# Patient Record
Sex: Male | Born: 1950 | Race: Black or African American | Hispanic: No | Smoking: Current every day smoker
Health system: Southern US, Community
[De-identification: ages and names within clinical notes are randomized; demographics above are authoritative.]

## PROBLEM LIST (undated history)

## (undated) DIAGNOSIS — Z87898 Personal history of other specified conditions: Secondary | ICD-10-CM

## (undated) DIAGNOSIS — R410 Disorientation, unspecified: Secondary | ICD-10-CM

## (undated) DIAGNOSIS — Z8719 Personal history of other diseases of the digestive system: Secondary | ICD-10-CM

## (undated) DIAGNOSIS — E876 Hypokalemia: Secondary | ICD-10-CM

## (undated) DIAGNOSIS — F1191 Opioid use, unspecified, in remission: Secondary | ICD-10-CM

## (undated) DIAGNOSIS — I10 Essential (primary) hypertension: Secondary | ICD-10-CM

## (undated) DIAGNOSIS — J811 Chronic pulmonary edema: Secondary | ICD-10-CM

## (undated) HISTORY — DX: Chronic pulmonary edema: J81.1

## (undated) HISTORY — PX: SHOULDER SURGERY: SHX246

## (undated) HISTORY — DX: Opioid use, unspecified, in remission: F11.91

## (undated) HISTORY — DX: Personal history of other diseases of the digestive system: Z87.19

## (undated) HISTORY — PX: CHOLECYSTECTOMY: SHX55

## (undated) HISTORY — DX: Disorientation, unspecified: R41.0

## (undated) HISTORY — DX: Hypokalemia: E87.6

## (undated) HISTORY — DX: Personal history of other specified conditions: Z87.898

---

## 1997-05-07 ENCOUNTER — Emergency Department (HOSPITAL_COMMUNITY): Admission: EM | Admit: 1997-05-07 | Discharge: 1997-05-07 | Payer: Self-pay | Admitting: Emergency Medicine

## 1997-06-11 ENCOUNTER — Emergency Department (HOSPITAL_COMMUNITY): Admission: EM | Admit: 1997-06-11 | Discharge: 1997-06-11 | Payer: Self-pay | Admitting: Cardiology

## 1997-08-09 ENCOUNTER — Inpatient Hospital Stay (HOSPITAL_COMMUNITY): Admission: EM | Admit: 1997-08-09 | Discharge: 1997-08-14 | Payer: Self-pay | Admitting: Emergency Medicine

## 1997-10-30 ENCOUNTER — Ambulatory Visit (HOSPITAL_COMMUNITY): Admission: RE | Admit: 1997-10-30 | Discharge: 1997-10-31 | Payer: Self-pay | Admitting: Orthopedic Surgery

## 2001-05-12 ENCOUNTER — Emergency Department (HOSPITAL_COMMUNITY): Admission: EM | Admit: 2001-05-12 | Discharge: 2001-05-12 | Payer: Self-pay | Admitting: Emergency Medicine

## 2004-11-25 ENCOUNTER — Inpatient Hospital Stay: Payer: Self-pay | Admitting: Internal Medicine

## 2004-11-25 ENCOUNTER — Other Ambulatory Visit: Payer: Self-pay

## 2005-01-10 ENCOUNTER — Observation Stay: Payer: Self-pay | Admitting: Internal Medicine

## 2005-05-19 ENCOUNTER — Encounter: Payer: Self-pay | Admitting: Orthopedic Surgery

## 2006-04-17 ENCOUNTER — Emergency Department (HOSPITAL_COMMUNITY): Admission: EM | Admit: 2006-04-17 | Discharge: 2006-04-17 | Payer: Self-pay | Admitting: Family Medicine

## 2006-07-08 ENCOUNTER — Emergency Department (HOSPITAL_COMMUNITY): Admission: EM | Admit: 2006-07-08 | Discharge: 2006-07-08 | Payer: Self-pay | Admitting: Emergency Medicine

## 2007-05-07 IMAGING — CR DG CHEST 2V
1 series · 2 of 2 positions shown · non-contrast
Comparison: none

REASON FOR EXAM: Confusion
COMMENTS:  LMP: (Male)

PROCEDURE:     DXR - DXR CHEST PA (OR AP) AND LATERAL  - January 10, 2005  [DATE]
RESULT:     Comparison is made to the prior study dated 11/25/04.
Lungs are clear.  The cardiac silhouette and visualized bony skeleton are
unremarkable.

[Series 2906: postero_anterior · 0.11mm/px · 2 of 2 slices shown]
[im 1/2]
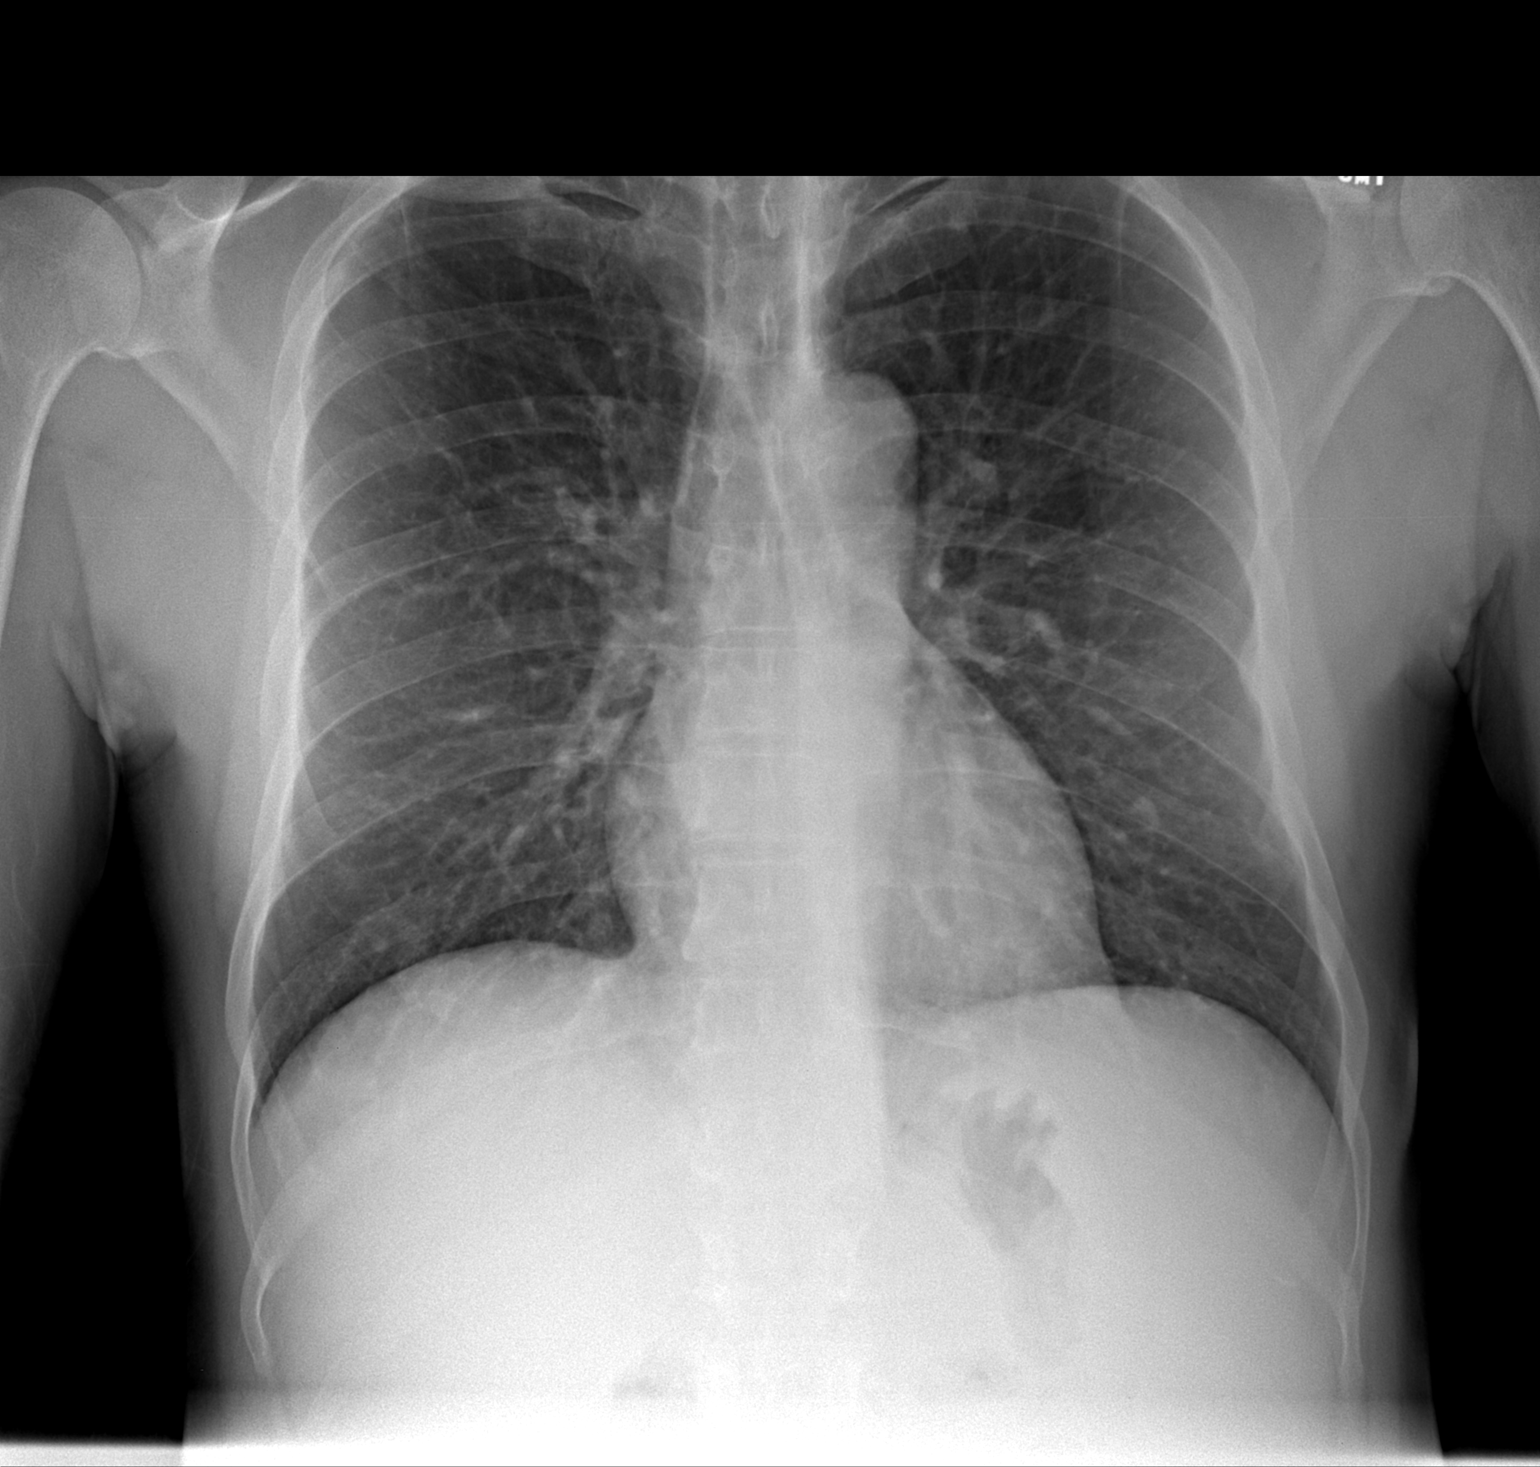
[im 2/2]
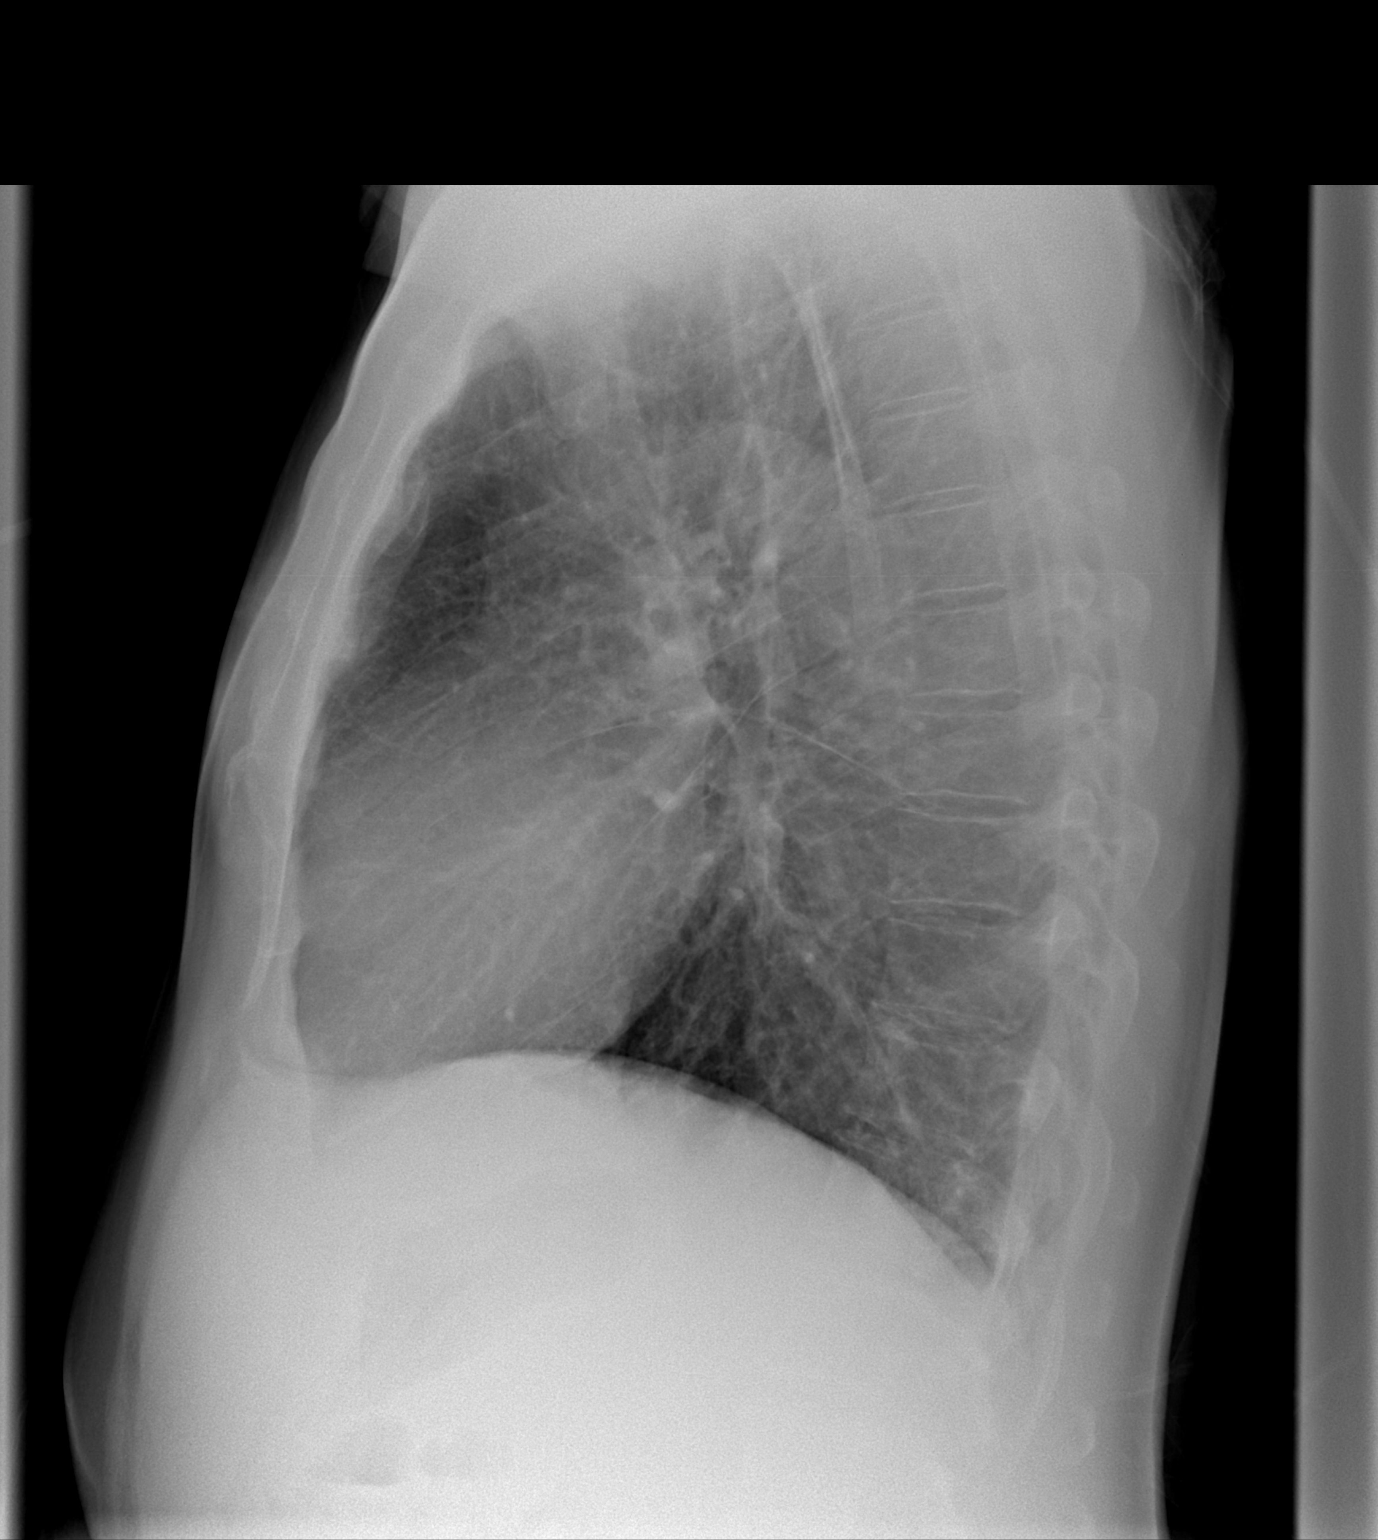

[2 of 2 positions shown; findings below may reference images not displayed]

IMPRESSION: Chest radiograph without evidence of acute cardiopulmonary disease.

## 2008-08-28 ENCOUNTER — Emergency Department: Payer: Self-pay | Admitting: Emergency Medicine

## 2008-12-29 ENCOUNTER — Emergency Department (HOSPITAL_COMMUNITY): Admission: EM | Admit: 2008-12-29 | Discharge: 2008-12-29 | Payer: Self-pay | Admitting: Emergency Medicine

## 2010-03-25 ENCOUNTER — Emergency Department (HOSPITAL_COMMUNITY)
Admission: EM | Admit: 2010-03-25 | Discharge: 2010-03-25 | Disposition: A | Discharge status: Home / Self Care | Payer: Self-pay | Attending: Emergency Medicine | Admitting: Emergency Medicine

## 2010-03-25 ENCOUNTER — Emergency Department (HOSPITAL_COMMUNITY): Payer: Self-pay

## 2010-03-25 DIAGNOSIS — E119 Type 2 diabetes mellitus without complications: Secondary | ICD-10-CM | POA: Insufficient documentation

## 2010-03-25 DIAGNOSIS — F1110 Opioid abuse, uncomplicated: Secondary | ICD-10-CM | POA: Insufficient documentation

## 2010-03-25 DIAGNOSIS — W010XXA Fall on same level from slipping, tripping and stumbling without subsequent striking against object, initial encounter: Secondary | ICD-10-CM | POA: Insufficient documentation

## 2010-03-25 DIAGNOSIS — S42033A Displaced fracture of lateral end of unspecified clavicle, initial encounter for closed fracture: Secondary | ICD-10-CM | POA: Insufficient documentation

## 2010-03-25 DIAGNOSIS — M25519 Pain in unspecified shoulder: Secondary | ICD-10-CM | POA: Insufficient documentation

## 2010-03-25 DIAGNOSIS — R42 Dizziness and giddiness: Secondary | ICD-10-CM | POA: Insufficient documentation

## 2010-03-25 DIAGNOSIS — Z794 Long term (current) use of insulin: Secondary | ICD-10-CM | POA: Insufficient documentation

## 2010-03-25 LAB — RAPID URINE DRUG SCREEN, HOSP PERFORMED
Benzodiazepines: NOT DETECTED
Cocaine: NOT DETECTED

## 2010-03-25 LAB — GLUCOSE, CAPILLARY: Glucose-Capillary: 203 mg/dL — ABNORMAL HIGH (ref 70–99)

## 2010-03-25 LAB — COMPREHENSIVE METABOLIC PANEL
ALT: 95 U/L — ABNORMAL HIGH (ref 0–53)
AST: 115 U/L — ABNORMAL HIGH (ref 0–37)
Calcium: 8.2 mg/dL — ABNORMAL LOW (ref 8.4–10.5)
Sodium: 139 mEq/L (ref 135–145)
Total Protein: 6.5 g/dL (ref 6.0–8.3)

## 2010-03-25 LAB — DIFFERENTIAL
Basophils Absolute: 0 10*3/uL (ref 0.0–0.1)
Basophils Relative: 0 % (ref 0–1)
Eosinophils Absolute: 0.1 10*3/uL (ref 0.0–0.7)
Eosinophils Relative: 2 % (ref 0–5)
Lymphocytes Relative: 24 % (ref 12–46)
Lymphs Abs: 1.1 10*3/uL (ref 0.7–4.0)
Monocytes Absolute: 0.4 10*3/uL (ref 0.1–1.0)
Monocytes Relative: 9 % (ref 3–12)
Neutro Abs: 3 10*3/uL (ref 1.7–7.7)
Neutrophils Relative %: 65 % (ref 43–77)

## 2010-03-25 LAB — CBC
MCH: 29.6 pg (ref 26.0–34.0)
MCHC: 33 g/dL (ref 30.0–36.0)
Platelets: 58 10*3/uL — ABNORMAL LOW (ref 150–400)

## 2010-04-07 ENCOUNTER — Emergency Department (HOSPITAL_COMMUNITY): Payer: Non-veteran care

## 2010-04-07 ENCOUNTER — Emergency Department (HOSPITAL_COMMUNITY)
Admission: EM | Admit: 2010-04-07 | Discharge: 2010-04-07 | Disposition: A | Discharge status: Home / Self Care | Payer: Non-veteran care | Attending: Emergency Medicine | Admitting: Emergency Medicine

## 2010-04-07 DIAGNOSIS — Z794 Long term (current) use of insulin: Secondary | ICD-10-CM | POA: Insufficient documentation

## 2010-04-07 DIAGNOSIS — Z79899 Other long term (current) drug therapy: Secondary | ICD-10-CM | POA: Insufficient documentation

## 2010-04-07 DIAGNOSIS — E119 Type 2 diabetes mellitus without complications: Secondary | ICD-10-CM | POA: Insufficient documentation

## 2010-04-07 DIAGNOSIS — S42033A Displaced fracture of lateral end of unspecified clavicle, initial encounter for closed fracture: Secondary | ICD-10-CM | POA: Insufficient documentation

## 2010-04-07 DIAGNOSIS — M25519 Pain in unspecified shoulder: Secondary | ICD-10-CM | POA: Insufficient documentation

## 2010-04-07 DIAGNOSIS — Z09 Encounter for follow-up examination after completed treatment for conditions other than malignant neoplasm: Secondary | ICD-10-CM | POA: Insufficient documentation

## 2010-04-07 DIAGNOSIS — W19XXXA Unspecified fall, initial encounter: Secondary | ICD-10-CM | POA: Insufficient documentation

## 2010-04-20 ENCOUNTER — Emergency Department (HOSPITAL_COMMUNITY)
Admission: EM | Admit: 2010-04-20 | Discharge: 2010-04-21 | Disposition: A | Discharge status: Home / Self Care | Payer: Non-veteran care | Attending: Emergency Medicine | Admitting: Emergency Medicine

## 2010-04-20 DIAGNOSIS — T400X1A Poisoning by opium, accidental (unintentional), initial encounter: Secondary | ICD-10-CM | POA: Insufficient documentation

## 2010-04-20 DIAGNOSIS — E119 Type 2 diabetes mellitus without complications: Secondary | ICD-10-CM | POA: Insufficient documentation

## 2010-04-20 DIAGNOSIS — Z79899 Other long term (current) drug therapy: Secondary | ICD-10-CM | POA: Insufficient documentation

## 2010-04-20 LAB — CBC
HCT: 37.5 % — ABNORMAL LOW (ref 39.0–52.0)
Hemoglobin: 12.3 g/dL — ABNORMAL LOW (ref 13.0–17.0)
MCH: 29.7 pg (ref 26.0–34.0)
MCHC: 32.8 g/dL (ref 30.0–36.0)
MCV: 90.6 fL (ref 78.0–100.0)

## 2010-04-20 LAB — DIFFERENTIAL
Basophils Relative: 0 % (ref 0–1)
Lymphs Abs: 1.2 10*3/uL (ref 0.7–4.0)
Monocytes Absolute: 1 10*3/uL (ref 0.1–1.0)
Monocytes Relative: 7 % (ref 3–12)
Neutro Abs: 12 10*3/uL — ABNORMAL HIGH (ref 1.7–7.7)

## 2010-04-21 ENCOUNTER — Emergency Department (HOSPITAL_COMMUNITY): Payer: Non-veteran care

## 2010-04-21 ENCOUNTER — Inpatient Hospital Stay (HOSPITAL_COMMUNITY)
Admission: EM | Admit: 2010-04-21 | Discharge: 2010-04-27 | DRG: 917 | Disposition: A | Discharge status: Home Health | Payer: Non-veteran care | Attending: Internal Medicine | Admitting: Internal Medicine

## 2010-04-21 DIAGNOSIS — R000 Tachycardia, unspecified: Secondary | ICD-10-CM | POA: Diagnosis present

## 2010-04-21 DIAGNOSIS — R404 Transient alteration of awareness: Secondary | ICD-10-CM | POA: Diagnosis present

## 2010-04-21 DIAGNOSIS — F17200 Nicotine dependence, unspecified, uncomplicated: Secondary | ICD-10-CM | POA: Diagnosis present

## 2010-04-21 DIAGNOSIS — F1020 Alcohol dependence, uncomplicated: Secondary | ICD-10-CM | POA: Diagnosis present

## 2010-04-21 DIAGNOSIS — T424X1A Poisoning by benzodiazepines, accidental (unintentional), initial encounter: Secondary | ICD-10-CM | POA: Diagnosis present

## 2010-04-21 DIAGNOSIS — T424X4A Poisoning by benzodiazepines, undetermined, initial encounter: Principal | ICD-10-CM | POA: Diagnosis present

## 2010-04-21 DIAGNOSIS — X58XXXA Exposure to other specified factors, initial encounter: Secondary | ICD-10-CM | POA: Diagnosis present

## 2010-04-21 DIAGNOSIS — J189 Pneumonia, unspecified organism: Secondary | ICD-10-CM | POA: Diagnosis present

## 2010-04-21 DIAGNOSIS — F1111 Opioid abuse, in remission: Secondary | ICD-10-CM | POA: Diagnosis present

## 2010-04-21 DIAGNOSIS — K7030 Alcoholic cirrhosis of liver without ascites: Secondary | ICD-10-CM | POA: Diagnosis present

## 2010-04-21 DIAGNOSIS — S42009A Fracture of unspecified part of unspecified clavicle, initial encounter for closed fracture: Secondary | ICD-10-CM | POA: Diagnosis present

## 2010-04-21 DIAGNOSIS — E876 Hypokalemia: Secondary | ICD-10-CM | POA: Diagnosis present

## 2010-04-21 DIAGNOSIS — E119 Type 2 diabetes mellitus without complications: Secondary | ICD-10-CM | POA: Diagnosis present

## 2010-04-21 DIAGNOSIS — Z7982 Long term (current) use of aspirin: Secondary | ICD-10-CM

## 2010-04-21 DIAGNOSIS — D696 Thrombocytopenia, unspecified: Secondary | ICD-10-CM | POA: Diagnosis present

## 2010-04-21 LAB — BASIC METABOLIC PANEL
BUN: 25 mg/dL — ABNORMAL HIGH (ref 6–23)
CO2: 25 mEq/L (ref 19–32)
Chloride: 103 mEq/L (ref 96–112)
Creatinine, Ser: 1.2 mg/dL (ref 0.4–1.5)
Glucose, Bld: 288 mg/dL — ABNORMAL HIGH (ref 70–99)

## 2010-04-21 LAB — POCT I-STAT, CHEM 8
Calcium, Ion: 1.08 mmol/L — ABNORMAL LOW (ref 1.12–1.32)
Glucose, Bld: 152 mg/dL — ABNORMAL HIGH (ref 70–99)
HCT: 40 % (ref 39.0–52.0)
TCO2: 30 mmol/L (ref 0–100)

## 2010-04-21 LAB — GLUCOSE, CAPILLARY

## 2010-04-21 LAB — RAPID URINE DRUG SCREEN, HOSP PERFORMED
Amphetamines: NOT DETECTED
Benzodiazepines: NOT DETECTED
Cocaine: NOT DETECTED
Tetrahydrocannabinol: NOT DETECTED

## 2010-04-21 LAB — HEPATIC FUNCTION PANEL
Bilirubin, Direct: 0.3 mg/dL (ref 0.0–0.3)
Indirect Bilirubin: 0.6 mg/dL (ref 0.3–0.9)
Total Protein: 7.1 g/dL (ref 6.0–8.3)

## 2010-04-21 LAB — ACETAMINOPHEN LEVEL

## 2010-04-22 ENCOUNTER — Emergency Department (HOSPITAL_COMMUNITY): Payer: Non-veteran care

## 2010-04-22 LAB — DIFFERENTIAL
Basophils Absolute: 0 10*3/uL (ref 0.0–0.1)
Basophils Relative: 0 % (ref 0–1)
Basophils Relative: 0 % (ref 0–1)
Eosinophils Absolute: 0 10*3/uL (ref 0.0–0.7)
Eosinophils Absolute: 0 10*3/uL (ref 0.0–0.7)
Eosinophils Relative: 0 % (ref 0–5)
Lymphs Abs: 1.2 10*3/uL (ref 0.7–4.0)
Monocytes Absolute: 0.4 10*3/uL (ref 0.1–1.0)
Monocytes Relative: 7 % (ref 3–12)
Monocytes Relative: 8 % (ref 3–12)
Neutrophils Relative %: 80 % — ABNORMAL HIGH (ref 43–77)
Neutrophils Relative %: 81 % — ABNORMAL HIGH (ref 43–77)

## 2010-04-22 LAB — POCT CARDIAC MARKERS
CKMB, poc: 3.9 ng/mL (ref 1.0–8.0)
Troponin i, poc: 0.07 ng/mL (ref 0.00–0.09)

## 2010-04-22 LAB — LIPID PANEL
Cholesterol: 121 mg/dL (ref 0–200)
HDL: 59 mg/dL (ref >39)
Total CHOL/HDL Ratio: 2.1 RATIO

## 2010-04-22 LAB — HEPATIC FUNCTION PANEL
ALT: 53 U/L (ref 0–53)
AST: 62 U/L — ABNORMAL HIGH (ref 0–37)
Alkaline Phosphatase: 75 U/L (ref 39–117)
Bilirubin, Direct: 0.4 mg/dL — ABNORMAL HIGH (ref 0.0–0.3)
Indirect Bilirubin: 0.8 mg/dL (ref 0.3–0.9)
Total Bilirubin: 1.2 mg/dL (ref 0.3–1.2)

## 2010-04-22 LAB — CK TOTAL AND CKMB (NOT AT ARMC)
CK, MB: 4.5 ng/mL — ABNORMAL HIGH (ref 0.3–4.0)
Relative Index: 0.9 (ref 0.0–2.5)
Relative Index: 1 (ref 0.0–2.5)
Relative Index: 1 (ref 0.0–2.5)
Total CK: 444 U/L — ABNORMAL HIGH (ref 7–232)

## 2010-04-22 LAB — URINE MICROSCOPIC-ADD ON

## 2010-04-22 LAB — MRSA PCR SCREENING: MRSA by PCR: NEGATIVE

## 2010-04-22 LAB — BASIC METABOLIC PANEL
BUN: 20 mg/dL (ref 6–23)
CO2: 23 mEq/L (ref 19–32)
Calcium: 8.6 mg/dL (ref 8.4–10.5)
Creatinine, Ser: 0.73 mg/dL (ref 0.4–1.5)
GFR calc non Af Amer: >60 mL/min (ref >60)
Glucose, Bld: 96 mg/dL (ref 70–99)
Sodium: 138 mEq/L (ref 135–145)

## 2010-04-22 LAB — COMPREHENSIVE METABOLIC PANEL
AST: 50 U/L — ABNORMAL HIGH (ref 0–37)
Albumin: 2.8 g/dL — ABNORMAL LOW (ref 3.5–5.2)
Alkaline Phosphatase: 56 U/L (ref 39–117)
Chloride: 107 mEq/L (ref 96–112)
GFR calc Af Amer: >60 mL/min (ref >60)
Potassium: 4 mEq/L (ref 3.5–5.1)
Total Bilirubin: 1.4 mg/dL — ABNORMAL HIGH (ref 0.3–1.2)

## 2010-04-22 LAB — LACTIC ACID, PLASMA: Lactic Acid, Venous: 1.5 mmol/L (ref 0.5–2.2)

## 2010-04-22 LAB — CBC
Hemoglobin: 11.3 g/dL — ABNORMAL LOW (ref 13.0–17.0)
MCH: 29.7 pg (ref 26.0–34.0)
MCH: 29.8 pg (ref 26.0–34.0)
MCHC: 33.5 g/dL (ref 30.0–36.0)
MCV: 88.7 fL (ref 78.0–100.0)
Platelets: 37 10*3/uL — ABNORMAL LOW (ref 150–400)
RBC: 3.8 MIL/uL — ABNORMAL LOW (ref 4.22–5.81)
WBC: 11.2 10*3/uL — ABNORMAL HIGH (ref 4.0–10.5)

## 2010-04-22 LAB — URINALYSIS, ROUTINE W REFLEX MICROSCOPIC
Bilirubin Urine: NEGATIVE
Glucose, UA: NEGATIVE mg/dL
Ketones, ur: 15 mg/dL — AB
Leukocytes, UA: NEGATIVE
Protein, ur: 30 mg/dL — AB
pH: 6 (ref 5.0–8.0)

## 2010-04-22 LAB — GLUCOSE, CAPILLARY

## 2010-04-22 LAB — RAPID URINE DRUG SCREEN, HOSP PERFORMED
Barbiturates: NOT DETECTED
Benzodiazepines: NOT DETECTED
Cocaine: NOT DETECTED
Opiates: POSITIVE — AB

## 2010-04-22 LAB — PROTIME-INR: Prothrombin Time: 15.3 seconds — ABNORMAL HIGH (ref 11.6–15.2)

## 2010-04-22 LAB — D-DIMER, QUANTITATIVE: D-Dimer, Quant: 0.46 ug/mL-FEU (ref 0.00–0.48)

## 2010-04-22 LAB — ACETAMINOPHEN LEVEL: Acetaminophen (Tylenol), Serum: 13.5 ug/mL (ref 10–30)

## 2010-04-22 LAB — AMMONIA: Ammonia: 53 umol/L — ABNORMAL HIGH (ref 11–35)

## 2010-04-23 ENCOUNTER — Inpatient Hospital Stay (HOSPITAL_COMMUNITY): Payer: Non-veteran care

## 2010-04-23 LAB — BASIC METABOLIC PANEL
CO2: 27 mEq/L (ref 19–32)
Calcium: 8 mg/dL — ABNORMAL LOW (ref 8.4–10.5)
Chloride: 106 mEq/L (ref 96–112)
Glucose, Bld: 130 mg/dL — ABNORMAL HIGH (ref 70–99)
Sodium: 139 mEq/L (ref 135–145)

## 2010-04-23 LAB — BLOOD GAS, ARTERIAL
Acid-Base Excess: 1.4 mmol/L (ref 0.0–2.0)
Drawn by: 155271
O2 Content: 3 L/min
O2 Saturation: 95.5 %
Patient temperature: 98.6
pCO2 arterial: 30.6 mmHg — ABNORMAL LOW (ref 35.0–45.0)

## 2010-04-23 LAB — CBC
HCT: 33.9 % — ABNORMAL LOW (ref 39.0–52.0)
Hemoglobin: 11.3 g/dL — ABNORMAL LOW (ref 13.0–17.0)
MCHC: 33.3 g/dL (ref 30.0–36.0)

## 2010-04-23 LAB — GLUCOSE, CAPILLARY
Glucose-Capillary: 140 mg/dL — ABNORMAL HIGH (ref 70–99)
Glucose-Capillary: 129 mg/dL — ABNORMAL HIGH (ref 70–99)
Glucose-Capillary: 109 mg/dL — ABNORMAL HIGH (ref 70–99)

## 2010-04-24 ENCOUNTER — Inpatient Hospital Stay (HOSPITAL_COMMUNITY): Payer: Non-veteran care

## 2010-04-24 LAB — CARDIAC PANEL(CRET KIN+CKTOT+MB+TROPI)
CK, MB: 1.3 ng/mL (ref 0.3–4.0)
Relative Index: INVALID (ref 0.0–2.5)
Relative Index: INVALID (ref 0.0–2.5)
Total CK: 69 U/L (ref 7–232)
Troponin I: 0.05 ng/mL (ref 0.00–0.06)

## 2010-04-24 LAB — COMPREHENSIVE METABOLIC PANEL WITH GFR
ALT: 60 U/L — ABNORMAL HIGH (ref 0–53)
AST: 64 U/L — ABNORMAL HIGH (ref 0–37)
Albumin: 2.8 g/dL — ABNORMAL LOW (ref 3.5–5.2)
Alkaline Phosphatase: 67 U/L (ref 39–117)
BUN: 8 mg/dL (ref 6–23)
CO2: 26 meq/L (ref 19–32)
Calcium: 8.1 mg/dL — ABNORMAL LOW (ref 8.4–10.5)
Chloride: 103 meq/L (ref 96–112)
Creatinine, Ser: 0.67 mg/dL (ref 0.4–1.5)
GFR calc non Af Amer: >60 mL/min
Glucose, Bld: 118 mg/dL — ABNORMAL HIGH (ref 70–99)
Potassium: 3.3 meq/L — ABNORMAL LOW (ref 3.5–5.1)
Sodium: 137 meq/L (ref 135–145)
Total Bilirubin: 2.1 mg/dL — ABNORMAL HIGH (ref 0.3–1.2)
Total Protein: 7 g/dL (ref 6.0–8.3)

## 2010-04-24 LAB — CBC
HCT: 38.8 % — ABNORMAL LOW (ref 39.0–52.0)
Hemoglobin: 12.8 g/dL — ABNORMAL LOW (ref 13.0–17.0)
MCV: 89.4 fL (ref 78.0–100.0)
RBC: 4.34 MIL/uL (ref 4.22–5.81)
WBC: 4.6 10*3/uL (ref 4.0–10.5)

## 2010-04-24 LAB — BASIC METABOLIC PANEL
BUN: 6 mg/dL (ref 6–23)
Chloride: 103 mEq/L (ref 96–112)
Glucose, Bld: 153 mg/dL — ABNORMAL HIGH (ref 70–99)
Potassium: 2.7 mEq/L — CL (ref 3.5–5.1)
Sodium: 139 mEq/L (ref 135–145)

## 2010-04-24 LAB — RPR: RPR Ser Ql: NONREACTIVE

## 2010-04-24 LAB — LIPASE, BLOOD: Lipase: 31 U/L (ref 11–59)

## 2010-04-24 LAB — AMMONIA: Ammonia: 39 umol/L — ABNORMAL HIGH (ref 11–35)

## 2010-04-25 LAB — CBC
MCH: 30.1 pg (ref 26.0–34.0)
MCHC: 33.9 g/dL (ref 30.0–36.0)
RDW: 15.2 % (ref 11.5–15.5)

## 2010-04-25 LAB — BASIC METABOLIC PANEL
CO2: 26 mEq/L (ref 19–32)
Calcium: 8.4 mg/dL (ref 8.4–10.5)
Creatinine, Ser: 0.6 mg/dL (ref 0.4–1.5)
GFR calc Af Amer: >60 mL/min (ref >60)
GFR calc non Af Amer: >60 mL/min (ref >60)
Glucose, Bld: 120 mg/dL — ABNORMAL HIGH (ref 70–99)
Sodium: 138 mEq/L (ref 135–145)

## 2010-04-25 LAB — GLUCOSE, CAPILLARY
Glucose-Capillary: 115 mg/dL — ABNORMAL HIGH (ref 70–99)
Glucose-Capillary: 91 mg/dL (ref 70–99)

## 2010-04-26 ENCOUNTER — Inpatient Hospital Stay (HOSPITAL_COMMUNITY): Payer: Non-veteran care

## 2010-04-26 LAB — GLUCOSE, CAPILLARY
Glucose-Capillary: 166 mg/dL — ABNORMAL HIGH (ref 70–99)
Glucose-Capillary: 146 mg/dL — ABNORMAL HIGH (ref 70–99)
Glucose-Capillary: 127 mg/dL — ABNORMAL HIGH (ref 70–99)

## 2010-04-26 LAB — BASIC METABOLIC PANEL
BUN: 10 mg/dL (ref 6–23)
CO2: 27 mEq/L (ref 19–32)
Chloride: 103 mEq/L (ref 96–112)
Creatinine, Ser: 0.7 mg/dL (ref 0.4–1.5)

## 2010-04-26 LAB — CBC
MCH: 29.3 pg (ref 26.0–34.0)
MCV: 89.2 fL (ref 78.0–100.0)
WBC: 5.4 10*3/uL (ref 4.0–10.5)

## 2010-04-27 LAB — BASIC METABOLIC PANEL
CO2: 26 mEq/L (ref 19–32)
Calcium: 8.5 mg/dL (ref 8.4–10.5)
Creatinine, Ser: 0.67 mg/dL (ref 0.4–1.5)
GFR calc Af Amer: >60 mL/min (ref >60)

## 2010-04-27 LAB — GLUCOSE, CAPILLARY

## 2010-04-27 NOTE — Discharge Summary (Signed)
NAME:  Sean Beck, Sean Beck NO.:  [id number]  MEDICAL RECORD NO.:  [id number]           PATIENT TYPE:  I  LOCATION:  5531                         FACILITY:  [facility]  PHYSICIAN:  Andreas Blower, MD       DATE OF BIRTH:  August 11, 1950  DATE OF ADMISSION:  04/21/2010 DATE OF DISCHARGE:                        DISCHARGE SUMMARY - REFERRING   PRIMARY CARE PHYSICIAN:  [facility].  DISCHARGE DIAGNOSES: 1. Acute delirium/altered mental status, resolved. 2. Mild elevated troponin, most likely due to mild demand ischemia,     resolved. 3. Tachypnea secondary to pulmonary edema, resolved. 4. Pulmonary edema. 5. Hypokalemia. 6. Chronic thrombocytopenia. 7. Committed fracture of distal left clavicle. 8. Hyperglycemia. 9. Pneumonia. 10.History of alcohol use. 11.History of heroin use. 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 13.Question on overdose of pain pill prior to presentation.  DISCHARGE MEDICATIONS: 1. Acetaminophen 650 mg every 6 hours as needed for pain, discomfort,     and fever. 2. Aspirin 81 mg p.o. daily. 3. Folic acid 1 mg p.o. daily. 4. Metoprolol 12.5 mg p.o. twice daily. 5. Multivitamin 1 tablet p.o. daily. 6. Thiamine 100 mg p.o. daily.  BRIEF ADMITTING HISTORY AND PHYSICAL:  Mr. Obeso is a 60 year old African American male with history of cirrhosis of liver, alcohol and heroin abuse with questionable history of hyperglycemia, who was brought in to the ER for altered mental status.  RADIOLOGY/IMAGING: 1. The patient had a portable chest x-ray, which shows bilateral lower     lobe hazy opacity, consistent with pulmonary edema; left upper lobe     opacity, now represents edema or infection. 2. 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  No evidence for acute intracranial     hemorrhage, infarct, or mass lesion. 3. The patient had a CT of the abdomen and pelvis without contrast,     which shows  diffuse air space disease in the lung base, which is     just pulmonary edema or aspiration pneumonia. 4. The patient had most recent portable chest x-ray on April 26, 2010,     which showed improved aeration with decreased interstitial edema. 5. The patient had a 2-D echocardiogram on April 22, 2010, which     showed left ventricle cavity size was normal, systolic function was     normal with ejection fraction of 55-60%.  There were no regional     wall motion abnormalities.  Septal motion showed abnormal function.     These changes are consistent with intraventricular conduction     delay.  LABORATORY DATA:  CBC shows a white count of 5.4, hemoglobin 12.2, hematocrit 37.1, platelet counts of 68.  Electrolytes normal with a creatinine of 0.67.  Sodium is 134.  Initial troponin on presentation was 0.20.  BNP was 151.  The patient's LDL was 50.  MRSA by PCR was negative.  RPR was nonreactive.  D-dimer was 0.46.  HOSPITAL COURSE BY PROBLEM: 1. Acute delirium/altered mental status, etiology unclear.  Based on     the admission H and P, the patient may have  apparently overdosed on     some pain pills.  I am uncertain if that is the cause for his acute     delirium.  However, the patient had the above imaging, which did     not show any findings.  The patient had a head CT, which did not     show any findings.  During the course of hospital stay, the     patient's mentation improved on his own.  The patient was on     telemetry and was monitored carefully. 2. Mildly elevated troponin, most likely due to mild demand ischemia,     altered mental status, tachycardia, and tachypnea.  Cardiology,     Dr. Eldridge Dace, was consulted.  Based on the 2-D echocardiogram,     Cardiology do not recommend any further workup. 3. Tachypnea and mild pulmonary edema, etiology unclear.  The patient     was given about 2-3 doses of Lasix during the course of the     hospital stay and resolved.  The patient was  transferred to step-     down on April 24, 2010, and given his improvement, he was     transferred out on April 25, 2010. 4. Hypokalemia, resolved with replacement. 5. Chronic thrombocytopenia, most likely due to cirrhosis. 6. Liver cirrhosis secondary to alcohol use.  Outpatient workup. 7. History of alcohol use.  The patient was placed on CIWA protocol.     Patient has been here in the hospital for at least 6 days.  At the     time of discharge, he will be discharged home on thiamine and     folate.  The patient was instructed not to drink any alcohol. 8. Tachycardia, most likely due to acute delirium and pain, started on     low-dose beta-blocker. 9. Comminuted fracture of distal left clavicle.  I spoke with Dr.     Ophelia Charter who will see the patient as an outpatient.  The patient was     given a sling to be placed in his arm.  The patient was given     p.r.n. oxycodone, however, this was not prescribed at discharge     given that the patient is going to a rehab facility, and they do     not allow narcotics.  The patient will take p.r.n. Tylenol, and     will have his arm in the sling to help with the pain. 10.Hyperglycemia.  The patient's hemoglobin A1c is 5.8.  Diet and     exercise and monitor blood sugars as outpatient. 11.Questionable pneumonia.  The patient initially was started on     ceftriaxone and azithromycin during the course of the hospital     stay.  Antibiotics were transitioned to levofloxacin.  The patient     completed course of antibiotics during the hospital stay. 12.History of heroin and alcohol use.  The patient to be transferred     to [facility] for further management.  DISPOSITION AND FOLLOWUP:  The patient to follow up with [facility] in 1 week.  The patient to follow with Dr. Ophelia Charter with orthopedic for the left clavicular fracture as outpatient, office number provided to the patient to make an appointment.  Time spent on discharge, talking the patient and  coordinating care, was at 35 minutes.   Andreas Blower, MD   SR/MEDQ  D:  04/27/2010  T:  04/27/2010  Job:  [id number]  Electronically Signed by Wardell Heath Maverik Foot  on  04/27/2010 06:03:47 PM
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
## 2010-08-19 ENCOUNTER — Other Ambulatory Visit: Payer: Self-pay

## 2010-08-19 ENCOUNTER — Other Ambulatory Visit: Payer: Self-pay | Admitting: *Deleted

## 2010-08-19 DIAGNOSIS — B182 Chronic viral hepatitis C: Secondary | ICD-10-CM

## 2010-08-20 ENCOUNTER — Other Ambulatory Visit: Payer: Self-pay | Admitting: *Deleted

## 2010-08-20 ENCOUNTER — Other Ambulatory Visit: Payer: Self-pay | Admitting: Family Medicine

## 2010-08-20 DIAGNOSIS — B182 Chronic viral hepatitis C: Secondary | ICD-10-CM

## 2010-08-24 ENCOUNTER — Other Ambulatory Visit: Payer: Non-veteran care

## 2010-08-25 ENCOUNTER — Ambulatory Visit
Admission: RE | Admit: 2010-08-25 | Discharge: 2010-08-25 | Disposition: A | Discharge status: Home / Self Care | Payer: Non-veteran care | Source: Ambulatory Visit | Attending: Family Medicine | Admitting: Family Medicine

## 2010-08-25 DIAGNOSIS — B182 Chronic viral hepatitis C: Secondary | ICD-10-CM

## 2010-10-27 ENCOUNTER — Emergency Department (HOSPITAL_COMMUNITY)
Admission: EM | Admit: 2010-10-27 | Discharge: 2010-10-27 | Disposition: A | Discharge status: Home / Self Care | Payer: Medicaid Other | Attending: Emergency Medicine | Admitting: Emergency Medicine

## 2010-10-27 DIAGNOSIS — Z794 Long term (current) use of insulin: Secondary | ICD-10-CM | POA: Insufficient documentation

## 2010-10-27 DIAGNOSIS — M2550 Pain in unspecified joint: Secondary | ICD-10-CM | POA: Insufficient documentation

## 2010-10-27 DIAGNOSIS — E119 Type 2 diabetes mellitus without complications: Secondary | ICD-10-CM | POA: Insufficient documentation

## 2010-10-27 DIAGNOSIS — Z79899 Other long term (current) drug therapy: Secondary | ICD-10-CM | POA: Insufficient documentation

## 2010-10-27 DIAGNOSIS — K7460 Unspecified cirrhosis of liver: Secondary | ICD-10-CM | POA: Insufficient documentation

## 2010-10-27 LAB — CBC
HCT: 35.9 % — ABNORMAL LOW (ref 39.0–52.0)
MCH: 28.4 pg (ref 26.0–34.0)
MCHC: 33.4 g/dL (ref 30.0–36.0)
MCV: 84.9 fL (ref 78.0–100.0)
RDW: 14 % (ref 11.5–15.5)

## 2010-10-27 LAB — DIFFERENTIAL
Basophils Relative: 0 % (ref 0–1)
Eosinophils Relative: 2 % (ref 0–5)
Lymphs Abs: 1.6 10*3/uL (ref 0.7–4.0)
Monocytes Absolute: 0.6 10*3/uL (ref 0.1–1.0)
Monocytes Relative: 10 % (ref 3–12)
Neutro Abs: 3.3 10*3/uL (ref 1.7–7.7)

## 2010-10-27 LAB — COMPREHENSIVE METABOLIC PANEL
Alkaline Phosphatase: 97 U/L (ref 39–117)
BUN: 31 mg/dL — ABNORMAL HIGH (ref 6–23)
Calcium: 10.3 mg/dL (ref 8.4–10.5)
Creatinine, Ser: 0.96 mg/dL (ref 0.50–1.35)
GFR calc Af Amer: >90 mL/min (ref >90)
Glucose, Bld: 54 mg/dL — ABNORMAL LOW (ref 70–99)
Potassium: 3.2 mEq/L — ABNORMAL LOW (ref 3.5–5.1)
Total Protein: 7.3 g/dL (ref 6.0–8.3)

## 2010-10-27 LAB — ETHANOL: Alcohol, Ethyl (B): <11 mg/dL (ref 0–11)

## 2010-10-27 LAB — URINE MICROSCOPIC-ADD ON

## 2010-10-27 LAB — URINALYSIS, ROUTINE W REFLEX MICROSCOPIC
Bilirubin Urine: NEGATIVE
Nitrite: NEGATIVE
Protein, ur: 30 mg/dL — AB
Urobilinogen, UA: 1 mg/dL (ref 0.0–1.0)

## 2010-10-27 LAB — RAPID URINE DRUG SCREEN, HOSP PERFORMED
Amphetamines: NOT DETECTED
Benzodiazepines: NOT DETECTED
Opiates: POSITIVE — AB

## 2010-10-27 LAB — GLUCOSE, CAPILLARY: Glucose-Capillary: 60 mg/dL — ABNORMAL LOW (ref 70–99)

## 2010-11-04 LAB — CULTURE, ROUTINE-ABSCESS

## 2011-07-19 ENCOUNTER — Emergency Department (HOSPITAL_COMMUNITY): Payer: Non-veteran care

## 2011-07-19 ENCOUNTER — Encounter (HOSPITAL_COMMUNITY): Payer: Self-pay | Admitting: Emergency Medicine

## 2011-07-19 ENCOUNTER — Emergency Department (HOSPITAL_COMMUNITY)
Admission: EM | Admit: 2011-07-19 | Discharge: 2011-07-19 | Disposition: A | Discharge status: Home / Self Care | Payer: Non-veteran care | Attending: Emergency Medicine | Admitting: Emergency Medicine

## 2011-07-19 DIAGNOSIS — Z79899 Other long term (current) drug therapy: Secondary | ICD-10-CM | POA: Insufficient documentation

## 2011-07-19 DIAGNOSIS — J3489 Other specified disorders of nose and nasal sinuses: Secondary | ICD-10-CM | POA: Insufficient documentation

## 2011-07-19 DIAGNOSIS — I10 Essential (primary) hypertension: Secondary | ICD-10-CM | POA: Insufficient documentation

## 2011-07-19 DIAGNOSIS — R42 Dizziness and giddiness: Secondary | ICD-10-CM | POA: Insufficient documentation

## 2011-07-19 DIAGNOSIS — G319 Degenerative disease of nervous system, unspecified: Secondary | ICD-10-CM | POA: Insufficient documentation

## 2011-07-19 DIAGNOSIS — R0981 Nasal congestion: Secondary | ICD-10-CM

## 2011-07-19 DIAGNOSIS — G9389 Other specified disorders of brain: Secondary | ICD-10-CM | POA: Insufficient documentation

## 2011-07-19 DIAGNOSIS — E119 Type 2 diabetes mellitus without complications: Secondary | ICD-10-CM | POA: Insufficient documentation

## 2011-07-19 HISTORY — DX: Essential (primary) hypertension: I10

## 2011-07-19 LAB — COMPREHENSIVE METABOLIC PANEL
ALT: 108 U/L — ABNORMAL HIGH (ref 0–53)
Alkaline Phosphatase: 127 U/L — ABNORMAL HIGH (ref 39–117)
CO2: 26 mEq/L (ref 19–32)
GFR calc Af Amer: >90 mL/min (ref >90)
GFR calc non Af Amer: >90 mL/min (ref >90)
Glucose, Bld: 109 mg/dL — ABNORMAL HIGH (ref 70–99)
Potassium: 3.3 mEq/L — ABNORMAL LOW (ref 3.5–5.1)
Sodium: 141 mEq/L (ref 135–145)
Total Bilirubin: 0.7 mg/dL (ref 0.3–1.2)

## 2011-07-19 LAB — GLUCOSE, CAPILLARY: Glucose-Capillary: 119 mg/dL — ABNORMAL HIGH (ref 70–99)

## 2011-07-19 LAB — CBC WITH DIFFERENTIAL/PLATELET
Hemoglobin: 13.2 g/dL (ref 13.0–17.0)
Lymphocytes Relative: 28 % (ref 12–46)
Lymphs Abs: 0.8 10*3/uL (ref 0.7–4.0)
MCV: 85.3 fL (ref 78.0–100.0)
Monocytes Relative: 8 % (ref 3–12)
Neutrophils Relative %: 62 % (ref 43–77)
Platelets: 41 10*3/uL — ABNORMAL LOW (ref 150–400)
RBC: 4.62 MIL/uL (ref 4.22–5.81)
WBC: 2.9 10*3/uL — ABNORMAL LOW (ref 4.0–10.5)

## 2011-07-19 MED ORDER — GADOBENATE DIMEGLUMINE 529 MG/ML IV SOLN
20.0000 mL | Freq: Once | INTRAVENOUS | Status: AC | PRN
  Administered 2011-07-19: 20 mL via INTRAVENOUS

## 2011-07-19 MED ORDER — MECLIZINE HCL 50 MG PO TABS: 50.0000 mg | ORAL_TABLET | Freq: Three times a day (TID) | ORAL | Status: AC | PRN

## 2011-07-19 NOTE — Discharge Instructions (Signed)
MRI is within normal limits as discussed with a neurologist. However your symptoms you're feeling are not normal Therefore I recommend further evaluation with your ophthalmologist or primary care physician

## 2011-07-19 NOTE — ED Notes (Signed)
[facility] tray ordered spoke with Carolyn 

## 2011-07-19 NOTE — ED Provider Notes (Signed)
History     CSN: [id number]  Arrival date & time 07/19/11  0846   First MD Initiated Contact with Patient 07/19/11 [phone]      Chief Complaint  Patient presents with  . Dizziness    (Consider location/radiation/quality/duration/timing/severity/associated sxs/prior treatment) HPI Comments: Patient presents with intermittent "dizziness" for the past 3 days and is becoming more severe and frequent. His difficult time describing his symptoms and denies room spinning or vertigo. He also denies lightheadedness or near-syncope. States it feels like a "flustering sensation" in his head. It is better with laying down and closing his eyes and worse with standing up and sitting up. He denies any visual change, chest pain, shortness of breath, nausea or vomiting. He denies any headache. He is not take any medications for the sensation. He has a history of diabetes and hypertension. No recent illnesses, vomiting or diarrhea. Good by mouth intake and blood sugars have been normal.  The history is provided by the patient.    Past Medical History  Diagnosis Date  . Hypertension   . Diabetes mellitus     History reviewed. No pertinent past surgical history.  History reviewed. No pertinent family history.  History  Substance Use Topics  . Smoking status: Never Smoker   . Smokeless tobacco: Not on file  . Alcohol Use: No      Review of Systems  Constitutional: Negative for fever, activity change and appetite change.  HENT: Negative for congestion and rhinorrhea.   Respiratory: Negative for cough, chest tightness and shortness of breath.   Cardiovascular: Negative for chest pain.  Gastrointestinal: Negative for nausea, vomiting and abdominal pain.  Genitourinary: Negative for dysuria.  Musculoskeletal: Negative for back pain.  Skin: Negative for wound.  Neurological: Positive for dizziness. Negative for weakness, light-headedness, numbness and headaches.  Hematological: Negative.      Allergies  Review of patient's allergies indicates no known allergies.  Home Medications   Current Outpatient Rx  Name Route Sig Dispense Refill  . GLIPIZIDE 10 MG PO TABS Oral Take 10 mg by mouth 2 (two) times daily before a meal.    . IBUPROFEN 200 MG PO TABS Oral Take 400 mg by mouth every 6 (six) hours as needed. For pain.    . INSULIN GLARGINE 100 UNIT/ML  SOLN Subcutaneous Inject 90 Units into the skin at bedtime.    Marland Kitchen LISINOPRIL 20 MG PO TABS Oral Take 10 mg by mouth daily.    . SERTRALINE HCL 100 MG PO TABS Oral Take 100 mg by mouth daily.    . MECLIZINE HCL 50 MG PO TABS Oral Take 1 tablet (50 mg total) by mouth 3 (three) times daily as needed. 30 tablet 0    BP 143/84  Pulse 78  Temp 98.1 F (36.7 C) (Oral)  Resp 12  SpO2 99%  Physical Exam  Constitutional: He is oriented to person, place, and time. He appears well-developed and well-nourished. No distress.  HENT:  Head: Normocephalic and atraumatic.  Mouth/Throat: Oropharynx is clear and moist. No oropharyngeal exudate.  Eyes: Conjunctivae are normal. Pupils are equal, round, and reactive to light.  Neck: Normal range of motion. Neck supple.  Cardiovascular: Normal rate, regular rhythm and normal heart sounds.   No murmur heard. Pulmonary/Chest: Effort normal and breath sounds normal. No respiratory distress.  Abdominal: There is no tenderness. There is no rebound and no guarding.  Musculoskeletal: Normal range of motion. He exhibits no edema and no tenderness.  Neurological: He is alert  and oriented to person, place, and time. He has normal reflexes. No cranial nerve deficit.       5 out of 5 strength throughout, cranial nerves III through XII intact, no ataxia finger to nose, no nystagmus. Positive Romberg with ataxic gait. No catch up saccades on head impulse testing.  Test of skew negative.  Skin: Skin is warm.    ED Course  Procedures (including critical care time)  Labs Reviewed  GLUCOSE, CAPILLARY  - Abnormal; Notable for the following:    Glucose-Capillary 119 (*)     All other components within normal limits  CBC WITH DIFFERENTIAL - Abnormal; Notable for the following:    WBC 2.9 (*)     Platelets 41 (*)  PLATELET COUNT CONFIRMED BY SMEAR   All other components within normal limits  COMPREHENSIVE METABOLIC PANEL - Abnormal; Notable for the following:    Potassium 3.3 (*)     Glucose, Bld 109 (*)     Albumin 3.2 (*)     AST 89 (*)     ALT 108 (*)     Alkaline Phosphatase 127 (*)     All other components within normal limits  CARDIAC PANEL(CRET KIN+CKTOT+MB+TROPI)   Mr Maxine Glenn Head Wo Contrast  07/19/2011  *RADIOLOGY REPORT*  Clinical Data:  Intermittent dizziness over past 3 years worse with standing or walking.  High blood pressure.  Diabetes.  MRI HEAD WITH AND WITHOUT CONTRAST MRA HEAD WITHOUT CONTRAST MRA NECK WITHOUT AND WITH CONTRAST  Technique: Multiplanar, multiecho pulse sequences of the brain and surrounding structures were obtained according to standard protocol with and without intravenous contrast.  Angiographic images of the Circle of Willis were obtained using MRA technique without intravenous contrast.  Angiographic images of the neck were obtained using MRA technique without and with intravenous contrast.  Contrast:20 ml MultiHance.  Comparison:04/22/2010 CT.  No comparison MR.  MRI HEAD  Findings: No acute infarct.  Encephalomalacia anterior frontal lobes greater on the right may be related to the prior trauma.  Remote infarcts a secondary consideration.  Global atrophy without hydrocephalus.  No intracranial hemorrhage.  Minimal small vessel disease type changes.  No intracranial mass or abnormal enhancement.  IMPRESSION: No acute infarct.  Please see above.  MRA HEAD  Findings:Motion degraded exam.  Narrowing of the supraclinoid aspect of the internal carotid artery greater on the left.  Tiny bulge inferior aspect M1 segment left middle cerebral artery possibly origin of a  vessel although a tiny aneurysm not excluded.  Mild narrowing M1 segment left middle cerebral artery.  Right vertebral artery is dominant and ectatic.  Narrowing of the distal left vertebral artery.  Irregular poorly delineated PICAs.  Nonvisualization AICAs.  Moderate narrowing proximal aspect of the posterior cerebral artery bilaterally greater on the right.  Posterior cerebral artery branch vessel irregularity greater on the right.  Prominence of the anterior communicating artery region without definitive aneurysm.  IMPRESSION:  Motion degraded exam.  Narrowing of the supraclinoid aspect of the internal carotid artery greater on the left.  Tiny bulge inferior aspect M1 segment left middle cerebral artery possibly origin of a vessel although a tiny aneurysm not excluded.  Mild narrowing M1 segment left middle cerebral artery.  Right vertebral artery is dominant and ectatic.  Narrowing of the distal left vertebral artery.  Irregular poorly delineated PICAs.  Nonvisualization AICAs.  Moderate narrowing proximal aspect of the posterior cerebral artery bilaterally greater on the right.  Posterior cerebral artery branch vessel irregularity greater on  the right.  MRA NECK  Findings:Exam limited by motion and phase of contrast enhancement.  Artifact extends through the origin of the great vessels.  This artifact is most notable involving the right common carotid artery and right subclavian artery and evaluating/grading stenosis at this level is therefore limited.  No obvious hemodynamically significant stenosis involving either carotid bifurcation.  Ectatic vertical cervical segment of the internal carotid artery bilaterally.  Limited evaluation of the origin of the vertebral arteries.  Beyond the origin, vertebral arteries are patent and ectatic.  IMPRESSION:  Exam limited by motion and phase of contrast enhancement.  Artifact extends through the origin of the great vessels.  This artifact is most notable involving the  right common carotid artery and right subclavian artery and evaluating/grading stenosis at this level is therefore limited.  No obvious hemodynamically significant stenosis involving either carotid bifurcation.  Ectatic vertical cervical segment of the internal carotid artery bilaterally.  Limited evaluation of the origin of the vertebral arteries.  Beyond the origin, vertebral arteries are patent and ectatic.  Original Report Authenticated By: Fuller Canada, M.D.   Mr Angiogram Neck W Wo Contrast  07/19/2011  *RADIOLOGY REPORT*  Clinical Data:  Intermittent dizziness over past 3 years worse with standing or walking.  High blood pressure.  Diabetes.  MRI HEAD WITH AND WITHOUT CONTRAST MRA HEAD WITHOUT CONTRAST MRA NECK WITHOUT AND WITH CONTRAST  Technique: Multiplanar, multiecho pulse sequences of the brain and surrounding structures were obtained according to standard protocol with and without intravenous contrast.  Angiographic images of the Circle of Willis were obtained using MRA technique without intravenous contrast.  Angiographic images of the neck were obtained using MRA technique without and with intravenous contrast.  Contrast:20 ml MultiHance.  Comparison:04/22/2010 CT.  No comparison MR.  MRI HEAD  Findings: No acute infarct.  Encephalomalacia anterior frontal lobes greater on the right may be related to the prior trauma.  Remote infarcts a secondary consideration.  Global atrophy without hydrocephalus.  No intracranial hemorrhage.  Minimal small vessel disease type changes.  No intracranial mass or abnormal enhancement.  IMPRESSION: No acute infarct.  Please see above.  MRA HEAD  Findings:Motion degraded exam.  Narrowing of the supraclinoid aspect of the internal carotid artery greater on the left.  Tiny bulge inferior aspect M1 segment left middle cerebral artery possibly origin of a vessel although a tiny aneurysm not excluded.  Mild narrowing M1 segment left middle cerebral artery.  Right  vertebral artery is dominant and ectatic.  Narrowing of the distal left vertebral artery.  Irregular poorly delineated PICAs.  Nonvisualization AICAs.  Moderate narrowing proximal aspect of the posterior cerebral artery bilaterally greater on the right.  Posterior cerebral artery branch vessel irregularity greater on the right.  Prominence of the anterior communicating artery region without definitive aneurysm.  IMPRESSION:  Motion degraded exam.  Narrowing of the supraclinoid aspect of the internal carotid artery greater on the left.  Tiny bulge inferior aspect M1 segment left middle cerebral artery possibly origin of a vessel although a tiny aneurysm not excluded.  Mild narrowing M1 segment left middle cerebral artery.  Right vertebral artery is dominant and ectatic.  Narrowing of the distal left vertebral artery.  Irregular poorly delineated PICAs.  Nonvisualization AICAs.  Moderate narrowing proximal aspect of the posterior cerebral artery bilaterally greater on the right.  Posterior cerebral artery branch vessel irregularity greater on the right.  MRA NECK  Findings:Exam limited by motion and phase of contrast enhancement.  Artifact  extends through the origin of the great vessels.  This artifact is most notable involving the right common carotid artery and right subclavian artery and evaluating/grading stenosis at this level is therefore limited.  No obvious hemodynamically significant stenosis involving either carotid bifurcation.  Ectatic vertical cervical segment of the internal carotid artery bilaterally.  Limited evaluation of the origin of the vertebral arteries.  Beyond the origin, vertebral arteries are patent and ectatic.  IMPRESSION:  Exam limited by motion and phase of contrast enhancement.  Artifact extends through the origin of the great vessels.  This artifact is most notable involving the right common carotid artery and right subclavian artery and evaluating/grading stenosis at this level is  therefore limited.  No obvious hemodynamically significant stenosis involving either carotid bifurcation.  Ectatic vertical cervical segment of the internal carotid artery bilaterally.  Limited evaluation of the origin of the vertebral arteries.  Beyond the origin, vertebral arteries are patent and ectatic.  Original Report Authenticated By: Fuller Canada, M.D.   Mr Laqueta Jean Wo Contrast  07/19/2011  *RADIOLOGY REPORT*  Clinical Data:  Intermittent dizziness over past 3 years worse with standing or walking.  High blood pressure.  Diabetes.  MRI HEAD WITH AND WITHOUT CONTRAST MRA HEAD WITHOUT CONTRAST MRA NECK WITHOUT AND WITH CONTRAST  Technique: Multiplanar, multiecho pulse sequences of the brain and surrounding structures were obtained according to standard protocol with and without intravenous contrast.  Angiographic images of the Circle of Willis were obtained using MRA technique without intravenous contrast.  Angiographic images of the neck were obtained using MRA technique without and with intravenous contrast.  Contrast:20 ml MultiHance.  Comparison:04/22/2010 CT.  No comparison MR.  MRI HEAD  Findings: No acute infarct.  Encephalomalacia anterior frontal lobes greater on the right may be related to the prior trauma.  Remote infarcts a secondary consideration.  Global atrophy without hydrocephalus.  No intracranial hemorrhage.  Minimal small vessel disease type changes.  No intracranial mass or abnormal enhancement.  IMPRESSION: No acute infarct.  Please see above.  MRA HEAD  Findings:Motion degraded exam.  Narrowing of the supraclinoid aspect of the internal carotid artery greater on the left.  Tiny bulge inferior aspect M1 segment left middle cerebral artery possibly origin of a vessel although a tiny aneurysm not excluded.  Mild narrowing M1 segment left middle cerebral artery.  Right vertebral artery is dominant and ectatic.  Narrowing of the distal left vertebral artery.  Irregular poorly delineated  PICAs.  Nonvisualization AICAs.  Moderate narrowing proximal aspect of the posterior cerebral artery bilaterally greater on the right.  Posterior cerebral artery branch vessel irregularity greater on the right.  Prominence of the anterior communicating artery region without definitive aneurysm.  IMPRESSION:  Motion degraded exam.  Narrowing of the supraclinoid aspect of the internal carotid artery greater on the left.  Tiny bulge inferior aspect M1 segment left middle cerebral artery possibly origin of a vessel although a tiny aneurysm not excluded.  Mild narrowing M1 segment left middle cerebral artery.  Right vertebral artery is dominant and ectatic.  Narrowing of the distal left vertebral artery.  Irregular poorly delineated PICAs.  Nonvisualization AICAs.  Moderate narrowing proximal aspect of the posterior cerebral artery bilaterally greater on the right.  Posterior cerebral artery branch vessel irregularity greater on the right.  MRA NECK  Findings:Exam limited by motion and phase of contrast enhancement.  Artifact extends through the origin of the great vessels.  This artifact is most notable involving the right common  carotid artery and right subclavian artery and evaluating/grading stenosis at this level is therefore limited.  No obvious hemodynamically significant stenosis involving either carotid bifurcation.  Ectatic vertical cervical segment of the internal carotid artery bilaterally.  Limited evaluation of the origin of the vertebral arteries.  Beyond the origin, vertebral arteries are patent and ectatic.  IMPRESSION:  Exam limited by motion and phase of contrast enhancement.  Artifact extends through the origin of the great vessels.  This artifact is most notable involving the right common carotid artery and right subclavian artery and evaluating/grading stenosis at this level is therefore limited.  No obvious hemodynamically significant stenosis involving either carotid bifurcation.  Ectatic vertical  cervical segment of the internal carotid artery bilaterally.  Limited evaluation of the origin of the vertebral arteries.  Beyond the origin, vertebral arteries are patent and ectatic.  Original Report Authenticated By: Fuller Canada, M.D.     1. Head congestion       MDM  "Dizziness" that is worsening, associated with antalgic gait.  No focal neuro deficits, worse with position change.  Patient has difficulty describing symptoms.  No focal neuro deficits. Somewhat atalagic gait with positive rhomberg. Given patient's difficulty describing symptoms and ataxia, plan evaluation of posterior circulation with MRI.  D/w CDU PA.  Date: 07/19/2011  Rate: 70  Rhythm: normal sinus rhythm  QRS Axis: normal  Intervals: normal  ST/T Wave abnormalities: normal  Conduction Disutrbances:none  Narrative Interpretation:   Old EKG Reviewed: unchanged          Glynn Octave, MD 07/19/11 1927

## 2011-07-19 NOTE — ED Notes (Signed)
C/o intermitttent dizziness & head feeling "flustered" since Friday especially when stands up. Denies h/a or injury.

## 2011-07-19 NOTE — ED Provider Notes (Signed)
Medical screening examination/treatment/procedure(s) were conducted as a shared visit with non-physician practitioner(s) and myself.  I personally evaluated the patient during the encounter   Glynn Octave, MD 07/19/11 1900

## 2011-07-19 NOTE — ED Provider Notes (Signed)
2:01 PM Patient complains of months of "abnormal feeling in head" States it was intermittent but this weekend, the sensation has lasted for longer periods of time. Denies aphasia, ataxia, sinus pressure or URI sxs. MRI does not indicate any acute causes of symptoms. I have discussed MRI results with Dr. Roseanne Reno, nephrology. 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 Reports no eye pain currently and the not feel that he has had worsening vision but is concerned that symptoms may be due to the fact he is not wearing corrective lenses. Advise close followup with his ophthalmologist. Patient voices understanding and is ready for discharge  Thomasene Lot, PA-C 07/19/11 1434

## 2011-07-19 NOTE — ED Notes (Signed)
Patient transported to MRI 

## 2011-07-19 NOTE — ED Notes (Signed)
Pt is currently in radiology for MRI

## 2011-07-19 NOTE — ED Notes (Signed)
Pt c/o intermittent dizziness x 3 days; pt sts better when laying down and closing eyes; pt denies any other complaint

## 2011-07-19 NOTE — ED Notes (Signed)
States dizziness worse with sitting & standing while obtaining orthostatic VS

## 2011-07-19 NOTE — ED Notes (Signed)
Pt states that he has been taking nyquil at night for the past 3-4 nights, the same amt of time he has been having these symptoms. States no nausea. States doesn't have sx when he lays down.

## 2011-09-27 ENCOUNTER — Emergency Department (HOSPITAL_COMMUNITY)
Admission: EM | Admit: 2011-09-27 | Discharge: 2011-10-01 | Disposition: A | Discharge status: Rehabilitation Facility | Payer: Non-veteran care | Attending: Emergency Medicine | Admitting: Emergency Medicine

## 2011-09-27 DIAGNOSIS — R110 Nausea: Secondary | ICD-10-CM | POA: Insufficient documentation

## 2011-09-27 DIAGNOSIS — F1310 Sedative, hypnotic or anxiolytic abuse, uncomplicated: Secondary | ICD-10-CM | POA: Insufficient documentation

## 2011-09-27 DIAGNOSIS — R739 Hyperglycemia, unspecified: Secondary | ICD-10-CM

## 2011-09-27 DIAGNOSIS — E119 Type 2 diabetes mellitus without complications: Secondary | ICD-10-CM | POA: Insufficient documentation

## 2011-09-27 DIAGNOSIS — I10 Essential (primary) hypertension: Secondary | ICD-10-CM | POA: Insufficient documentation

## 2011-09-27 DIAGNOSIS — F1110 Opioid abuse, uncomplicated: Secondary | ICD-10-CM

## 2011-09-27 DIAGNOSIS — Z794 Long term (current) use of insulin: Secondary | ICD-10-CM | POA: Insufficient documentation

## 2011-09-27 LAB — COMPREHENSIVE METABOLIC PANEL
ALT: 114 U/L — ABNORMAL HIGH (ref 0–53)
AST: 272 U/L — ABNORMAL HIGH (ref 0–37)
Alkaline Phosphatase: 97 U/L (ref 39–117)
Calcium: 8.9 mg/dL (ref 8.4–10.5)
Chloride: 98 mEq/L (ref 96–112)
GFR calc Af Amer: >90 mL/min (ref >90)
GFR calc non Af Amer: >90 mL/min (ref >90)
Total Protein: 6.9 g/dL (ref 6.0–8.3)

## 2011-09-27 LAB — CBC WITH DIFFERENTIAL/PLATELET
Basophils Relative: 0 % (ref 0–1)
Eosinophils Absolute: 0 10*3/uL (ref 0.0–0.7)
HCT: 34.9 % — ABNORMAL LOW (ref 39.0–52.0)
Hemoglobin: 11.8 g/dL — ABNORMAL LOW (ref 13.0–17.0)
MCH: 28.9 pg (ref 26.0–34.0)
MCHC: 33.8 g/dL (ref 30.0–36.0)
MCV: 85.5 fL (ref 78.0–100.0)
Monocytes Absolute: 0.5 10*3/uL (ref 0.1–1.0)
Monocytes Relative: 13 % — ABNORMAL HIGH (ref 3–12)
Neutro Abs: 2.6 10*3/uL (ref 1.7–7.7)

## 2011-09-27 LAB — SALICYLATE LEVEL: Salicylate Lvl: <2 mg/dL — ABNORMAL LOW (ref 2.8–20.0)

## 2011-09-27 MED ORDER — LOPERAMIDE HCL 2 MG PO CAPS: 2.0000 mg | ORAL_CAPSULE | ORAL | Status: DC | PRN

## 2011-09-27 MED ORDER — LISINOPRIL 10 MG PO TABS
5.0000 mg | ORAL_TABLET | Freq: Once | ORAL | Status: AC
  Administered 2011-09-27: 5 mg via ORAL
  Filled 2011-09-27 (×2): qty 1

## 2011-09-27 MED ORDER — ONDANSETRON 4 MG PO TBDP
4.0000 mg | ORAL_TABLET | Freq: Four times a day (QID) | ORAL | Status: DC | PRN
  Administered 2011-09-27 – 2011-09-30 (×2): 4 mg via ORAL
  Filled 2011-09-27 (×2): qty 1

## 2011-09-27 MED ORDER — METHOCARBAMOL 500 MG PO TABS
500.0000 mg | ORAL_TABLET | Freq: Three times a day (TID) | ORAL | Status: DC | PRN
  Administered 2011-09-30: 500 mg via ORAL
  Filled 2011-09-27: qty 1

## 2011-09-27 MED ORDER — HYDROXYZINE HCL 25 MG PO TABS
25.0000 mg | ORAL_TABLET | Freq: Four times a day (QID) | ORAL | Status: DC | PRN
  Administered 2011-09-27: 25 mg via ORAL
  Filled 2011-09-27: qty 1

## 2011-09-27 MED ORDER — DICYCLOMINE HCL 20 MG PO TABS
20.0000 mg | ORAL_TABLET | Freq: Four times a day (QID) | ORAL | Status: DC | PRN
  Administered 2011-09-30 (×2): 20 mg via ORAL
  Filled 2011-09-27 (×3): qty 1

## 2011-09-27 MED ORDER — NAPROXEN 250 MG PO TABS
500.0000 mg | ORAL_TABLET | Freq: Two times a day (BID) | ORAL | Status: DC | PRN
  Administered 2011-09-29 – 2011-10-01 (×4): 500 mg via ORAL
  Filled 2011-09-27 (×4): qty 2

## 2011-09-27 MED ORDER — INSULIN ASPART 100 UNIT/ML ~~LOC~~ SOLN
0.0000 [IU] | Freq: Three times a day (TID) | SUBCUTANEOUS | Status: DC
  Administered 2011-09-27: 11 [IU] via SUBCUTANEOUS
  Administered 2011-09-28: 3 [IU] via SUBCUTANEOUS
  Administered 2011-09-28 – 2011-09-29 (×3): 5 [IU] via SUBCUTANEOUS
  Administered 2011-09-29 – 2011-09-30 (×4): 3 [IU] via SUBCUTANEOUS
  Administered 2011-09-30: 5 [IU] via SUBCUTANEOUS
  Administered 2011-10-01: 2 [IU] via SUBCUTANEOUS
  Filled 2011-09-27 (×4): qty 1
  Filled 2011-09-27: qty 5
  Filled 2011-09-27 (×7): qty 1

## 2011-09-27 NOTE — ED Notes (Signed)
Patient has one cell phone, one gold and silver watch, one wallet with misc. cards and papers. Barry Brunner has $47 in cash documented and placed in a patient valuables envelope, and $5.52 in change. All of the above is placed in a patient valuables envelope and given to security. Patient and belongings wanded by security.

## 2011-09-27 NOTE — BH Assessment (Signed)
Assessment Note   Sean Beck is an 61 y.o. male that presented to the ED at the request of the [facility], where he resides, for Opioid Dependence.  Pt reports taking over 10 10mg  Oxys or Roxys QD for the past year or more.  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  His COWS score was a 12. Pt admits that his Mother self-medicated with "pills" as well.  Pt has endured numerous losses as well within the last year and voices being treated with Zoloft by the [facility].  Pt denies SI or HI, but admits that he  feels helpless and hopeless and is grieving the losses of numerous family.  Pt voices taking his Zoloft as prescribed.  Pt reports decreased sleep and increased anxiety.  Pt denies any legal history and is requesting inpatient detox for current symptoms.  Writer contacted the [facility] for continuity of care, but it is currently full.  Axis I: Opioid Withdrawal and Major Depression, recurrent, moderate Axis II: Deferred Axis III:  Past Medical History  Diagnosis Date  . Hypertension   . Diabetes mellitus    Axis IV: other psychosocial or environmental problems, problems related to social environment and problems with primary support group Axis V: 31-40 impairment in reality testing  Past Medical History:  Past Medical History  Diagnosis Date  . Hypertension   . Diabetes mellitus     No past surgical history on file.  Family History: No family history on file.  Social History:  reports that he has never smoked. He does not have any smokeless tobacco history on file. He reports that he does not drink alcohol or use illicit drugs.  Additional Social History:  Alcohol / Drug Use Pain Medications: Yes; see MAR Prescriptions: Yes; see MAR Over the  Counter: No; see MAR History of alcohol / drug use?: Yes Substance #1 Name of Substance 1: Opiates 1 - Age of First Use: 57 1 - Amount (size/oz): up to 15 10 mg Oxys or Hydrocodone's 1 - Frequency: QD 1 - Duration: one year + 1 - Last Use / Amount: this morning- 09/09  CIWA: CIWA-Ar BP: 107/66 mmHg Pulse Rate: 81  COWS: Clinical Opiate Withdrawal Scale (COWS) Resting Pulse Rate: Pulse Rate 81-100 Sweating: Flushed or Observable moistness on face Restlessness: Frequent shifting or extraneous movements of legs/arms Pupil Size: Pupils pinned or normal size for room light Bone or Joint Aches: Not present Runny Nose or Tearing: Not present GI Upset: No GI symptoms Tremor: Slight tremor observable Yawning: No yawning Anxiety or Irritability: Patient reports increasing irritability or anxiousness Gooseflesh Skin: Piloerection of skin can be felt or hairs standing up on arms COWS Total Score: 12   Allergies: No Known Allergies  Home Medications:  (Not in a hospital admission)  OB/GYN Status:  No LMP for male patient.  General Assessment Data Location of Assessment: [facility] ED ACT Assessment: Yes Living Arrangements: Other (Comment) (Resides at the [facility]) Can pt return to current living arrangement?: Yes Admission Status: Voluntary Is patient capable of signing voluntary admission?: Yes Transfer from: Acute Hospital Referral Source: Self/Family/Friend  Education Status Is patient currently in school?: No  Risk to self Suicidal Ideation: No Suicidal Intent: No Is patient at risk for suicide?:  No Suicidal Plan?: No Access to Means: No What has been your use of drugs/alcohol within the last 12 months?: Opiates  Previous Attempts/Gestures: No How many times?: 0  Other Self Harm Risks: reckless and impulsive Triggers for Past Attempts: Unpredictable Intentional Self Injurious Behavior: Damaging Family Suicide History: No Recent stressful life event(s): Conflict  (Comment);Recent negative physical changes;Loss (Comment) (Numerous family deaths in the past year or more.) Persecutory voices/beliefs?: No Depression: Yes Depression Symptoms: Guilt;Feeling worthless/self pity;Isolating Substance abuse history and/or treatment for substance abuse?: Yes Suicide prevention information given to non-admitted patients: Not applicable  Risk to Others Homicidal Ideation: No Thoughts of Harm to Others: No Current Homicidal Intent: No Current Homicidal Plan: No Access to Homicidal Means: No History of harm to others?: No Assessment of Violence: None Noted Violent Behavior Description: none per pt Does patient have access to weapons?: No Criminal Charges Pending?: No Does patient have a court date: No  Psychosis Hallucinations: None noted Delusions: None noted  Mental Status Report Appear/Hygiene: Disheveled Eye Contact: Fair Motor Activity: Restlessness;Tremors Speech: Soft;Slow;Slurred Level of Consciousness: Quiet/awake Mood: Depressed;Apathetic;Sullen Affect: Anxious;Apathetic;Appropriate to circumstance Anxiety Level: Moderate Thought Processes: Relevant Judgement: Impaired Orientation: Person;Place;Time;Situation Obsessive Compulsive Thoughts/Behaviors: Severe  Cognitive Functioning Concentration: Decreased Memory: Recent Impaired;Remote Impaired IQ: Average Insight: Poor Impulse Control: Poor Appetite: Fair Weight Loss:  (3-4 pounds) Weight Gain: 0  Sleep: Decreased Total Hours of Sleep:  ("about five hours") Vegetative Symptoms: None  ADLScreening [facility] Assessment Services) Patient's cognitive ability adequate to safely complete daily activities?: Yes Patient able to express need for assistance with ADLs?: Yes Independently performs ADLs?: Yes (appropriate for developmental age)  Abuse/Neglect [facility]) Physical Abuse: Denies Verbal Abuse: Denies Sexual Abuse: Denies  Prior Inpatient Therapy Prior Inpatient Therapy: Yes Prior  Therapy Dates: 2009 [facility] Reason [facility]   Prior Outpatient Therapy Prior Outpatient Therapy: Yes Prior Therapy Dates: currently [facility] Reason for Treatment: SA and depression  ADL Screening (condition at time of admission) Patient's cognitive ability adequate to safely complete daily activities?: Yes Patient able to express need for assistance with ADLs?: Yes Independently performs ADLs?: Yes (appropriate for developmental age)       Abuse/Neglect Assessment (Assessment to be complete while patient is alone) Physical Abuse: Denies Verbal Abuse: Denies Sexual Abuse: Denies Exploitation of patient/patient's resources: Denies Self-Neglect: Denies Values / Beliefs Cultural Requests During Hospitalization: None Spiritual Requests During Hospitalization: None   Advance Directives (For Healthcare) Advance Directive: Patient does not have advance directive Pre-existing out of facility DNR order (yellow form or pink MOST form): No    Additional Information 1:1 In Past 12 Months?: No CIRT Risk: No Elopement Risk: No Does patient have medical clearance?: Yes     Disposition:  [facility] currently has no beds.  Pt is unsure whether he is Medicare/Medicaid eligible, which would deem him inadmissable to [facility] or [facility].  Please screen for possible inpatient treatment.     Disposition Disposition of Patient: Inpatient treatment program Type of inpatient treatment program: Adult  On Site Evaluation by:   Reviewed with Physician:     Angelica Ran 09/27/2011 2:24 PM

## 2011-09-27 NOTE — ED Provider Notes (Signed)
History    This chart was scribed for Sean Racer, MD, MD by Smitty Pluck. The patient was seen in room [facility] and the patient's care was started at 11:25AM.   CSN: [id number]  Arrival date & time 09/27/11  1034   First MD Initiated Contact with Patient 09/27/11 1125      Chief Complaint  Patient presents with  . Addiction Problem    (Consider location/radiation/quality/duration/timing/severity/associated sxs/prior treatment) The history is provided by the patient. No language interpreter was used.   Sean Beck is a 61 y.o. male who presents to the Emergency Department wanting detox due to addiction to hydrocodone. Reports having mild nausea. Pt reports 5-6 10 mg pills/day. Pt reports last using (2 10 mg hydrocodone) this AM. Pt reports being in detox before for hydrocodone. Denies SI and HI. Reports hx of depression, diabetes and HTN. Pt reports that he takes lisinopril for HTN. Denies SOB, abdominal pain, vomiting and diarrhea.     Past Medical History  Diagnosis Date  . Hypertension   . Diabetes mellitus     No past surgical history on file.  No family history on file.  History  Substance Use Topics  . Smoking status: Never Smoker   . Smokeless tobacco: Not on file  . Alcohol Use: No      Review of Systems  Constitutional: Negative for fever and chills.  Respiratory: Negative for shortness of breath.   Gastrointestinal: Positive for nausea. Negative for vomiting.  Neurological: Negative for weakness.    Allergies  Review of patient's allergies indicates no known allergies.  Home Medications   Current Outpatient Rx  Name Route Sig Dispense Refill  . GLIPIZIDE 10 MG PO TABS Oral Take 10 mg by mouth 2 (two) times daily before a meal.    . HYDROCODONE-ACETAMINOPHEN 10-325 MG PO TABS Oral Take 1-2 tablets by mouth every 4 (four) hours as needed. For pain    . HYDROXYZINE HCL PO Oral Take 1 tablet by mouth 2 (two) times daily as needed. For itching    .  IBUPROFEN 200 MG PO TABS Oral Take 600 mg by mouth every 6 (six) hours as needed. For pain.    . INSULIN GLARGINE 100 UNIT/ML [place] SOLN Subcutaneous Inject 90 Units into the skin at bedtime.    . SERTRALINE HCL 100 MG PO TABS Oral Take 100 mg by mouth daily.      BP 107/66  Pulse 81  Temp 98 F (36.7 C)  Resp 18  SpO2 99%  Physical Exam  Nursing note and vitals reviewed. Constitutional: He is oriented to person, place, and time. He appears well-developed and well-nourished. No distress.  HENT:  Head: Normocephalic and atraumatic.  Eyes: EOM are normal.  Neck: Neck supple. No tracheal deviation present.  Cardiovascular: Normal rate.   Pulmonary/Chest: Effort normal. No respiratory distress.  Musculoskeletal: Normal range of motion.  Neurological: He is alert and oriented to person, place, and time.  Skin: Skin is warm and dry.  Psychiatric: He has a normal mood and affect. His behavior is normal.    ED Course  Procedures (including critical care time) DIAGNOSTIC STUDIES: Oxygen Saturation is 96% on room air, normal by my interpretation.    COORDINATION OF CARE: 11:28 AM Discussed pt ED treatment with pt  11:44 AM Ordered:   Medications  HYDROcodone-acetaminophen (NORCO) 10-325 MG per tablet (not administered)  HYDROXYZINE HCL PO (not administered)  dicyclomine (BENTYL) tablet 20 mg (not administered)  hydrOXYzine (ATARAX/VISTARIL) tablet 25  mg (25 mg Oral Given 09/27/11 1213)  loperamide (IMODIUM) capsule 2-4 mg (not administered)  methocarbamol (ROBAXIN) tablet 500 mg (not administered)  naproxen (NAPROSYN) tablet 500 mg (not administered)  ondansetron (ZOFRAN-ODT) disintegrating tablet 4 mg (4 mg Oral Given 09/27/11 1213)  insulin aspart (novoLOG) injection 0-15 Units (not administered)  lisinopril (PRINIVIL,ZESTRIL) tablet 5 mg (5 mg Oral Given 09/27/11 1213)       Labs Reviewed  CBC WITH DIFFERENTIAL - Abnormal; Notable for the following:    RBC 4.08 (*)      Hemoglobin 11.8 (*)     HCT 34.9 (*)     Platelets 32 (*)  PLATELET COUNT CONFIRMED BY SMEAR   Monocytes Relative 13 (*)     All other components within normal limits  COMPREHENSIVE METABOLIC PANEL - Abnormal; Notable for the following:    Glucose, Bld 383 (*)     Albumin 3.4 (*)     AST 272 (*)     ALT 114 (*)     Total Bilirubin 1.5 (*)     All other components within normal limits  SALICYLATE LEVEL - Abnormal; Notable for the following:    Salicylate Lvl <2.0 (*)     All other components within normal limits  GLUCOSE, CAPILLARY - Abnormal; Notable for the following:    Glucose-Capillary 347 (*)     All other components within normal limits  ETHANOL  ACETAMINOPHEN LEVEL  URINALYSIS, ROUTINE W REFLEX MICROSCOPIC  URINE RAPID DRUG SCREEN (HOSP PERFORMED)   No results found.   1. Narcotic abuse   2. Hyperglycemia       MDM  I personally performed the services described in this documentation, which was scribed in my presence. The recorded information has been reviewed and considered.  Pt has chronically low platelets and elevated LFT's though they appear more exaggerated than previously recorded. Has no N/V abd pain, active bleeding. I do not believe this requires further workup as inpatient and is cleared for psychiatirc eval. Will treat elevated glu with SSI  Sean Racer, MD 09/27/11 [phone]

## 2011-09-27 NOTE — ED Notes (Signed)
Patient has eye glasses with him.

## 2011-09-27 NOTE — ED Notes (Signed)
One pair of white underwear, one pack of cigarettes, one pack of matches, one pair of white socks, one pair of black shoes, one pair of blue pajama pants, one white t-shirt, one pair of pants with a brown belt, one striped shirt and one red plastic bag all in one white patient belongings bag. Red plastic bag contains one pack of gummies, one stick of deo, 2 hair combs, one handkerchief, and one pen.

## 2011-09-27 NOTE — ED Notes (Addendum)
Pt sts that he has been taking Vicodinsince last September. sts he wants detox. sts has one this am and takes about 6 or 7 a day

## 2011-09-27 NOTE — ED Notes (Addendum)
Renelda Mom (nephew) [phone]

## 2011-09-27 NOTE — ED Notes (Signed)
Blood glucose currently 153 mg/dl

## 2011-09-27 NOTE — ED Notes (Signed)
Notified RN of CBG 347

## 2011-09-28 ENCOUNTER — Encounter (HOSPITAL_COMMUNITY): Payer: Self-pay | Admitting: *Deleted

## 2011-09-28 ENCOUNTER — Emergency Department (HOSPITAL_COMMUNITY): Payer: Non-veteran care

## 2011-09-28 LAB — GLUCOSE, CAPILLARY
Glucose-Capillary: 274 mg/dL — ABNORMAL HIGH (ref 70–99)
Glucose-Capillary: 176 mg/dL — ABNORMAL HIGH (ref 70–99)

## 2011-09-28 MED ORDER — LATANOPROST 0.005 % OP SOLN
1.0000 [drp] | Freq: Every day | OPHTHALMIC | Status: DC
  Administered 2011-09-28 – 2011-09-30 (×3): 1 [drp] via OPHTHALMIC
  Filled 2011-09-28: qty 2.5

## 2011-09-28 MED ORDER — PREDNISOLONE ACETATE 1 % OP SUSP
1.0000 [drp] | Freq: Two times a day (BID) | OPHTHALMIC | Status: DC
Start: 2011-09-28 — End: 2011-10-01
  Administered 2011-09-28 – 2011-10-01 (×6): 1 [drp] via OPHTHALMIC
  Filled 2011-09-28 (×2): qty 1

## 2011-09-28 MED ORDER — DORZOLAMIDE HCL 2 % OP SOLN
1.0000 [drp] | Freq: Three times a day (TID) | OPHTHALMIC | Status: DC
  Administered 2011-09-28 – 2011-10-01 (×9): 1 [drp] via OPHTHALMIC
  Filled 2011-09-28: qty 10

## 2011-09-28 NOTE — ED Notes (Signed)
Breakfast tray has arrived.

## 2011-09-28 NOTE — ED Notes (Signed)
Visitor here to see pt, visitor was not wanded at main entrance; security called and will wand visitor at nurses' station; visitor made aware of our policy, understands and acknowledged

## 2011-09-28 NOTE — ED Notes (Signed)
The patient's CBG was 228.

## 2011-09-28 NOTE — ED Notes (Signed)
Pt ambulatory to the bathroom after receiving a phone call from family

## 2011-09-28 NOTE — ED Notes (Signed)
Spoke with Patient - OK to speak with Sister regarding medical condition and status updates.

## 2011-09-28 NOTE — ED Notes (Signed)
Pt up ambulatory to the bathroom at this time 

## 2011-09-28 NOTE — BH Assessment (Signed)
Assessment Note   Sean Beck is an 61 y.o. male that was reassessed this day.  Pt continues to report withdrawal symptoms of stomach upset and chills from opiates.  Pt denies SI/HI or psychosis currently.  Pt is treated at [facility] on an outpatient basis for depression by report.  Called [place], no beds per [place] @ 1118.  Called [place], no beds per [place] @ 1120.  Pt declined at [facility] due to medical acuity, and [place] referral recommended.  Called [place] and per [place], beds available @ 1130.  Per Nicholos Johns at [facility], there is a wait list, but pt referral can be faxed and considered for wait list if pt not accepted elsewhere.  Referral faxed for review.  Completed reassessment, assessment notification and faxed to [facility] to log.  Updated ED staff.  Previous Note:  Sean Beck is an 61 y.o. male that presented to the ED at the request of the [facility], where he resides, for Opioid Dependence. Pt reports taking over 10 10mg  Oxys or Roxys QD for the past year or more. 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 His COWS score was a 12. Pt admits that his Mother self-medicated with "pills" as well. Pt has endured numerous losses as well within the last year and voices being treated with Zoloft by the [facility]. Pt denies SI or HI, but admits that he feels helpless and hopeless and is grieving the losses of numerous family. Pt voices taking his Zoloft as prescribed. Pt reports decreased sleep and increased anxiety. Pt denies any legal history and is requesting inpatient detox for current symptoms. Writer contacted the [facility] for continuity of care, but it is currently full.   Axis I: Opioid Dependence, MDD, Recurrent, Moderate Axis II:  Deferred Axis III:  Past Medical History  Diagnosis Date  . Hypertension   . Diabetes mellitus    Axis IV: other psychosocial or environmental problems, problems related to social environment and problems with primary support group Axis V: 31-40 impairment in reality testing  Past Medical History:  Past Medical History  Diagnosis Date  . Hypertension   . Diabetes mellitus     History reviewed. No pertinent past surgical history.  Family History: History reviewed. No pertinent family history.  Social History:  reports that he has never smoked. He does not have any smokeless tobacco history on file. He reports that he does not drink alcohol or use illicit drugs.  Additional Social History:  Alcohol / Drug Use Pain Medications: Yes; see MAR Prescriptions: Yes; see MAR Over the Counter: No; see MAR History of alcohol / drug use?: Yes Longest period of sobriety (when/how long): unknown Negative Consequences of Use: Personal relationships Withdrawal Symptoms: Diarrhea;Fever / Chills Substance #1 Name of Substance 1: Opiates 1 - Age of First Use: 57 1 - Amount (size/oz): up to 15 10 mg Oxys or Hydrocodone's 1 - Frequency: QD 1 - Duration: one year + 1 - Last Use / Amount: this morning- 09/09  CIWA: CIWA-Ar BP: 103/54 mmHg Pulse Rate: 71  COWS: Clinical Opiate Withdrawal Scale (COWS) Resting Pulse Rate: Pulse Rate 80 or below Sweating: No report of chills or flushing Restlessness: Able to sit still Pupil Size: Pupils pinned or normal size  for room light Bone or Joint Aches: Not present Runny Nose or Tearing: Not present GI Upset: Vomiting or diarrhea Tremor: Tremor can be felt, but not observed Yawning: No yawning Anxiety or Irritability: Patient reports increasing irritability or anxiousness Gooseflesh Skin: Skin is smooth COWS Total Score: 5   Allergies: No Known Allergies  Home Medications:  (Not in a hospital admission)  OB/GYN Status:  No LMP for male  patient.  General Assessment Data Location of Assessment: [facility] ED ACT Assessment: Yes Living Arrangements: Other (Comment) [facility]) Can pt return to current living arrangement?: Yes Admission Status: Voluntary Is patient capable of signing voluntary admission?: Yes Transfer from: Acute Hospital Referral Source: Self/Family/Friend  Education Status Is patient currently in school?: No  Risk to self Suicidal Ideation: No Suicidal Intent: No Is patient at risk for suicide?: No Suicidal Plan?: No Access to Means: No What has been your use of drugs/alcohol within the last 12 months?: Opiate use Previous Attempts/Gestures: No How many times?: 0  Other Self Harm Risks: reckless/impulsive Triggers for Past Attempts: Unpredictable Intentional Self Injurious Behavior: Damaging Comment - Self Injurious Behavior: SA Family Suicide History: No Recent stressful life event(s): Conflict (Comment);Recent negative physical changes;Loss (Comment) (Numerous family deaths in past year) Persecutory voices/beliefs?: No Depression: Yes Depression Symptoms: Despondent;Guilt;Feeling worthless/self pity;Isolating Substance abuse history and/or treatment for substance abuse?: Yes Suicide prevention information given to non-admitted patients: Not applicable  Risk to Others Homicidal Ideation: No Thoughts of Harm to Others: No Current Homicidal Intent: No Current Homicidal Plan: No Access to Homicidal Means: No Identified Victim: pt denies History of harm to others?: No Assessment of Violence: None Noted Violent Behavior Description: none per pt Does patient have access to weapons?: No Criminal Charges Pending?: No Does patient have a court date: No  Psychosis Hallucinations: None noted Delusions: None noted  Mental Status Report Appear/Hygiene: Disheveled Eye Contact: Fair Motor Activity: Unremarkable Speech: Soft;Slow;Slurred Level of Consciousness: Quiet/awake Mood:  Depressed Affect: Appropriate to circumstance Anxiety Level: Minimal Thought Processes: Coherent;Relevant Judgement: Impaired Orientation: Person;Place;Time;Situation Obsessive Compulsive Thoughts/Behaviors: Severe  Cognitive Functioning Concentration: Decreased Memory: Recent Impaired;Remote Impaired IQ: Average Insight: Poor Impulse Control: Poor Appetite: Fair Weight Loss:  (3-4 lbs) Weight Gain: 0  Sleep: Decreased Total Hours of Sleep:  ("about 5 hours") Vegetative Symptoms: None  ADLScreening [facility] Assessment Services) Patient's cognitive ability adequate to safely complete daily activities?: Yes Patient able to express need for assistance with ADLs?: Yes Independently performs ADLs?: Yes (appropriate for developmental age)  Abuse/Neglect [facility]) Physical Abuse: Denies Verbal Abuse: Denies Sexual Abuse: Denies  Prior Inpatient Therapy Prior Inpatient Therapy: Yes Prior Therapy Dates: 2009 [facility] Reason for Treatment: SA   Prior Outpatient Therapy Prior Outpatient Therapy: Yes Prior Therapy Dates: currently [facility] Reason for Treatment: SA and depression  ADL Screening (condition at time of admission) Patient's cognitive ability adequate to safely complete daily activities?: Yes Patient able to express need for assistance with ADLs?: Yes Independently performs ADLs?: Yes (appropriate for developmental age) Weakness of Legs: None Weakness of Arms/Hands: None  Home Assistive Devices/Equipment Home Assistive Devices/Equipment: None    Abuse/Neglect Assessment (Assessment to be complete while patient is alone) Physical Abuse: Denies Verbal Abuse: Denies Sexual Abuse: Denies Exploitation of patient/patient's resources: Denies Self-Neglect: Denies Values / Beliefs Cultural Requests During Hospitalization: None Spiritual Requests During Hospitalization: None Consults Spiritual Care Consult Needed:  No Social Work Consult Needed: No Merchant navy officer (For Healthcare) Advance Directive: Patient does not have advance directive;Patient  would not like information Pre-existing out of facility DNR order (yellow form or pink MOST form): No    Additional Information 1:1 In Past 12 Months?: No CIRT Risk: No Elopement Risk: No Does patient have medical clearance?: Yes     Disposition:  Disposition Disposition of Patient: Referred to;Inpatient treatment program Type of inpatient treatment program: Adult Patient referred to: Other (Comment) (Pending [place])  On Site Evaluation by:   Reviewed with Physician:  Caryl Asp, Rennis Harding 09/28/2011 12:49 PM

## 2011-09-28 NOTE — ED Notes (Signed)
The delay in the EKG was the patient had been taken to X-Ray before the EKG could be done.

## 2011-09-28 NOTE — ED Notes (Signed)
Ambulatory to restroom without event; sandwich meal given per pt request

## 2011-09-28 NOTE — ED Notes (Signed)
Pt returned to room from bathroom.

## 2011-09-28 NOTE — ED Notes (Signed)
Pt appears to be asleep on stretcher

## 2011-09-28 NOTE — ED Notes (Signed)
Lunch Delivered 

## 2011-09-28 NOTE — ED Notes (Signed)
The patient's CBG was 176.

## 2011-09-28 NOTE — ED Notes (Signed)
Pt's family member stated that pt needs to have eye drops. Pt stated he had eye surgery several weeks ago and had either 2 or 3 different types of eye drops he used daily (he wasn't sure whether it was 2 or 3).  Pt could not tell me the exact date of the surgery, the name of the surgeon, or the facility. Pt stated the surgery was performed at "the [place] [facility] in [facility]."  No listing for an eye clinic in [place], [place] on [place] website.  Phone numbers for outpatient clinics in [place] were not answered. Will request additional information from patient and family member.

## 2011-09-28 NOTE — ED Notes (Signed)
Pt up ambulatory to the bathroom.

## 2011-09-28 NOTE — ED Notes (Signed)
Security wanded visitor

## 2011-09-29 LAB — GLUCOSE, CAPILLARY
Glucose-Capillary: 204 mg/dL — ABNORMAL HIGH (ref 70–99)
Glucose-Capillary: 174 mg/dL — ABNORMAL HIGH (ref 70–99)
Glucose-Capillary: 178 mg/dL — ABNORMAL HIGH (ref 70–99)

## 2011-09-29 NOTE — ED Notes (Signed)
Pt's CBG is 178. RN Magda Paganini notified.

## 2011-09-29 NOTE — BH Assessment (Signed)
Assessment Note   Sean Beck is an 61 y.o. male.  This clinician reassessed patient this evening.  Patient reports no SI, HI or A/V hallucinations.  He reports that his withdrawal symptoms are not that strong now.  His hands are steady and he reports only mild stomach upset.  Patient was told about no beds at [facility] in [place] (per [place] at 21:26) and no beds at [facility] in [place] (per AOD at 21:29).  Patient said that he was wanting to go back to [facility] tomorrow (09/12) and that he had spoken to the director yesterday.  According to patient, the director, Darryl Rivers [phone], had told him he had a spot for him to return to.  Patient said that he would like to be able to call the director about leaving with him tomorrow if possible.  Clinician told him that he would need to call tomorrow and make arrangements and have assessment clinician verify that the he could return then.  Patient said that is what he would like to do. Previous Note: Sean Beck is an 61 y.o. male that was reassessed this day. Pt continues to report withdrawal symptoms of stomach upset and chills from opiates. Pt denies SI/HI or psychosis currently. Pt is treated at [facility] on an outpatient basis for depression by report. Called [place], no beds per [place] @ 1118. Called [place], no beds per [place] @ 1120. Pt declined at [facility] due to medical acuity, and [place] referral recommended. Called [place] and per [place], beds available @ 1130. Per Nicholos Johns at [facility], there is a wait list, but pt referral can be faxed and considered for wait list if pt not accepted elsewhere. Referral faxed for review. Completed reassessment, assessment notification and faxed to [facility] to log. Updated ED staff.  Previous Note:  Sean Beck is an 61 y.o. male that presented to the ED at the request of the [facility], where he resides, for Opioid Dependence. Pt reports taking over 10 10mg  Oxys or Roxys QD for the past  year or more. 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 His COWS score was a 12. Pt admits that his Mother self-medicated with "pills" as well. Pt has endured numerous losses as well within the last year and voices being treated with Zoloft by the [facility]. Pt denies SI or HI, but admits that he feels helpless and hopeless and is grieving the losses of numerous family. Pt voices taking his Zoloft as prescribed. Pt reports decreased sleep and increased anxiety. Pt denies any legal history and is requesting inpatient detox for current symptoms. Writer contacted the [facility] for continuity of care, but it is currently full.   Axis I: Major Depression, Recurrent severe Axis II: Deferred Axis III:  Past Medical History  Diagnosis Date  . Hypertension   . Diabetes mellitus    Axis IV: housing problems, occupational problems and other psychosocial or environmental problems Axis V: 41-50 serious symptoms  Past Medical History:  Past Medical History  Diagnosis Date  . Hypertension   . Diabetes mellitus     History reviewed. No pertinent past surgical history.  Family History: History reviewed. No pertinent family history.  Social History:  reports that he has never smoked. He  does not have any smokeless tobacco history on file. He reports that he does not drink alcohol or use illicit drugs.  Additional Social History:  Alcohol / Drug Use Pain Medications: Yes; see MAR Prescriptions: Yes; see MAR Over the Counter: No; see MAR History of alcohol / drug use?: Yes Longest period of sobriety (when/how long): unknown Negative Consequences of Use: Personal relationships Withdrawal Symptoms: Diarrhea;Fever / Chills Substance #1 Name of Substance 1: Opiates 1 - Age  of First Use: 57 1 - Amount (size/oz): up to 15 10 mg Oxys or Hydrocodone's 1 - Frequency: QD 1 - Duration: one year + 1 - Last Use / Amount: this morning- 09/09  CIWA: CIWA-Ar BP: 138/79 mmHg Pulse Rate: 85  Nausea and Vomiting: no nausea and no vomiting Tactile Disturbances: none Tremor: no tremor Auditory Disturbances: not present Paroxysmal Sweats: no sweat visible Visual Disturbances: not present Anxiety: no anxiety, at ease Headache, Fullness in Head: none present Agitation: normal activity Orientation and Clouding of Sensorium: oriented and can do serial additions CIWA-Ar Total: 0  COWS: Clinical Opiate Withdrawal Scale (COWS) Resting Pulse Rate: Pulse Rate 80 or below Sweating: No report of chills or flushing Restlessness: Able to sit still Pupil Size: Pupils pinned or normal size for room light Bone or Joint Aches: Not present Runny Nose or Tearing: Not present GI Upset: Vomiting or diarrhea Tremor: Tremor can be felt, but not observed Yawning: No yawning Anxiety or Irritability: Patient reports increasing irritability or anxiousness Gooseflesh Skin: Skin is smooth COWS Total Score: 5   Allergies: No Known Allergies  Home Medications:  (Not in a hospital admission)  OB/GYN Status:  No LMP for male patient.  General Assessment Data Location of Assessment: [facility] ED ACT Assessment: Yes Living Arrangements: Other (Comment) (Staying at [facility] house) Can pt return to current living arrangement?: Yes (Reports that director, [employer] said he could come back) Admission Status: Voluntary Is patient capable of signing voluntary admission?: Yes Transfer from: Acute Hospital Referral Source: Self/Family/Friend  Education Status Is patient currently in school?: No  Risk to self Suicidal Ideation: No Suicidal Intent: No Is patient at risk for suicide?: No Suicidal Plan?: No Access to Means: No What has been your use of drugs/alcohol within the last 12 months?:  Opiate abuse Previous Attempts/Gestures: No How many times?: 0  Other Self Harm Risks: Reckless/impulsive Triggers for Past Attempts: Unpredictable Intentional Self Injurious Behavior: Damaging Comment - Self Injurious Behavior: SA issues Family Suicide History: No Recent stressful life event(s): Loss (Comment);Conflict (Comment);Recent negative physical changes (Lost many family members this last year) Persecutory voices/beliefs?: No Depression: Yes Depression Symptoms: Isolating;Despondent;Guilt;Loss of interest in usual pleasures;Feeling worthless/self pity Substance abuse history and/or treatment for substance abuse?: Yes Suicide prevention information given to non-admitted patients: Not applicable  Risk to Others Homicidal Ideation: No Thoughts of Harm to Others: No Current Homicidal Intent: No Current Homicidal Plan: No Access to Homicidal Means: No Identified Victim: No one History of harm to others?: No Assessment of Violence: None Noted Violent Behavior Description: None reported Does patient have access to weapons?: No Criminal Charges Pending?: No Does patient have a court date: No  Psychosis Hallucinations: None noted Delusions: None noted  Mental Status Report Appear/Hygiene: Disheveled Eye Contact: Good Motor Activity: Freedom of movement;Unremarkable Speech: Soft;Slow Level of Consciousness: Alert Mood: Depressed Affect: Appropriate to circumstance Anxiety Level: Minimal Thought Processes: Coherent;Relevant Judgement: Unimpaired Orientation: Person;Place;Time;Situation Obsessive Compulsive Thoughts/Behaviors: Severe  Cognitive Functioning Concentration: Decreased Memory: Recent Intact;Remote Impaired IQ: Average Insight:  Fair Impulse Control: Poor Appetite: Fair Weight Loss:  (3-4 lbs) Weight Gain: 0  Sleep: Decreased Total Hours of Sleep:  (Reports "around 5 hours") Vegetative Symptoms: None  ADLScreening [facility] Assessment Services) Patient's  cognitive ability adequate to safely complete daily activities?: Yes Patient able to express need for assistance with ADLs?: Yes Independently performs ADLs?: Yes (appropriate for developmental age)  Abuse/Neglect [facility]) Physical Abuse: Denies Verbal Abuse: Denies Sexual Abuse: Denies  Prior Inpatient Therapy Prior Inpatient Therapy: Yes Prior Therapy Dates: 2009 [facility] Reason [facility]: [facility]   Prior Outpatient Therapy Prior Outpatient Therapy: Yes Prior Therapy Dates: currently [facility] Reason for Treatment: SA and depression  ADL Screening (condition at time of admission) Patient's cognitive ability adequate to safely complete daily activities?: Yes Patient able to express need for assistance with ADLs?: Yes Independently performs ADLs?: Yes (appropriate for developmental age) Weakness of Legs: None Weakness of Arms/Hands: None  Home Assistive Devices/Equipment Home Assistive Devices/Equipment: None    Abuse/Neglect Assessment (Assessment to be complete while patient is alone) Physical Abuse: Denies Verbal Abuse: Denies Sexual Abuse: Denies Exploitation of patient/patient's resources: Denies Self-Neglect: Denies Values / Beliefs Cultural Requests During Hospitalization: None Spiritual Requests During Hospitalization: None Consults Spiritual Care Consult Needed: No Social Work Consult Needed: No Merchant navy officer (For Healthcare) Advance Directive: Patient does not have advance directive;Patient would not like information Pre-existing out of facility DNR order (yellow form or pink MOST form): No Nutrition Screen- [facility] Adult/WL/AP Patient's home diet: Regular  Additional Information 1:1 In Past 12 Months?: No CIRT Risk: No Elopement Risk: No Does patient have medical clearance?: Yes     Disposition:  Disposition Disposition of Patient: Referred to;Inpatient treatment program Type of inpatient  treatment program: Adult Patient referred to: Other (Comment) (May be able to go back to [facility] on 09/12)  On Site Evaluation by:   Reviewed with Physician:     Beatriz Stallion Ray 09/29/2011 10:29 PM

## 2011-09-29 NOTE — ED Notes (Signed)
PT has telephone charger and cell phone in room.

## 2011-09-30 ENCOUNTER — Encounter (HOSPITAL_COMMUNITY): Payer: Self-pay | Admitting: Emergency Medicine

## 2011-09-30 LAB — GLUCOSE, CAPILLARY: Glucose-Capillary: 225 mg/dL — ABNORMAL HIGH (ref 70–99)

## 2011-09-30 MED ORDER — ROCURONIUM BROMIDE 50 MG/5ML IV SOLN
INTRAVENOUS | Status: AC
  Filled 2011-09-30: qty 2

## 2011-09-30 MED ORDER — SUCCINYLCHOLINE CHLORIDE 20 MG/ML IJ SOLN
INTRAMUSCULAR | Status: AC
  Filled 2011-09-30: qty 10

## 2011-09-30 MED ORDER — LIDOCAINE HCL (CARDIAC) 20 MG/ML IV SOLN
INTRAVENOUS | Status: AC
  Filled 2011-09-30: qty 5

## 2011-09-30 MED ORDER — ETOMIDATE 2 MG/ML IV SOLN
INTRAVENOUS | Status: AC
  Filled 2011-09-30: qty 20

## 2011-09-30 NOTE — BH Assessment (Signed)
Assessment Note   Sean Beck is an 61 y.o. male that was reassessed this day.  Pt denies SI/HI or psychosis.  Pt complains of mild stomach upset.  [facility], spoke with the director, Darryl Rivers [phone], who stated they would accept pt back to [facility] and send someone to pick him up from ER once discharged.  He just asked to be called tomorrow if pt is to be considered for discharge.  Consulted with ED Ignacia Palma, who stated he would like pt to remain in ED and be reassessed tomorrow due to his stomach issues.  ED staff and [facility] notified.  Pt agreeable to this.  Oncoming staff will need to follow up in AM.  Completed reassessment, assessment notification and faxed to [facility] to log.    Previous Note:  Sean Beck is an 61 y.o. male.  This clinician reassessed patient this evening. Patient reports no SI, HI or A/V hallucinations. He reports that his withdrawal symptoms are not that strong now. His hands are steady and he reports only mild stomach upset. Patient was told about no beds at [facility] in [place] (per [place] at 21:26) and no beds at [facility] in [place] (per AOD at 21:29). Patient said that he was wanting to go back to [facility] tomorrow (09/12) and that he had spoken to the director yesterday. According to patient, the director, Darryl Rivers [phone], had told him he had a spot for him to return to. Patient said that he would like to be able to call the director about leaving with him tomorrow if possible. Clinician told him that he would need to call tomorrow and make arrangements and have assessment clinician verify that the he could return then. Patient said that is what he would like to do.   Axis I: Major Depression, Recurrent severe without psychotic features Axis II: Deferred Axis III:  Past Medical History  Diagnosis Date  . Hypertension   . Diabetes mellitus    Axis IV: other psychosocial or environmental problems, problems related to  social environment and problems with primary support group Axis V: 41-50 serious symptoms  Past Medical History:  Past Medical History  Diagnosis Date  . Hypertension   . Diabetes mellitus     Past Surgical History  Procedure Date  . Cholecystectomy     Family History: History reviewed. No pertinent family history.  Social History:  reports that he has never smoked. He does not have any smokeless tobacco history on file. He reports that he does not drink alcohol or use illicit drugs.  Additional Social History:  Alcohol / Drug Use Pain Medications: Yes; see MAR Prescriptions: Yes; see MAR Over the Counter: No; see MAR History of alcohol / drug use?: Yes Longest period of sobriety (when/how long): unknown Negative Consequences of Use: Personal relationships Withdrawal Symptoms: Diarrhea;Fever / Chills Substance #1 Name of Substance 1: Opiates 1 - Age of First Use: 57 1 - Amount (size/oz): up to 15 10 mg Oxys or Hydrocodone's 1 - Frequency: QD 1 - Duration: one year + 1 - Last Use / Amount: this morning- 09/09  CIWA: CIWA-Ar BP: 137/63 mmHg Pulse Rate: 71  Nausea and Vomiting: no nausea and no vomiting Tactile Disturbances: none Tremor: no tremor Auditory Disturbances: not present Paroxysmal Sweats: no sweat visible Visual Disturbances: not present Anxiety: no anxiety, at ease Headache, Fullness in Head: none present Agitation: normal activity Orientation and Clouding of Sensorium: oriented and can do serial additions  CIWA-Ar Total: 0  COWS: Clinical Opiate Withdrawal Scale (COWS) Resting Pulse Rate: Pulse Rate 80 or below Sweating: No report of chills or flushing Restlessness: Able to sit still Pupil Size: Pupils pinned or normal size for room light Bone or Joint Aches: Not present Runny Nose or Tearing: Not present GI Upset: Vomiting or diarrhea Tremor: Tremor can be felt, but not observed Yawning: No yawning Anxiety or Irritability: Patient reports  increasing irritability or anxiousness Gooseflesh Skin: Skin is smooth COWS Total Score: 5   Allergies: No Known Allergies  Home Medications:  (Not in a hospital admission)  OB/GYN Status:  No LMP for male patient.  General Assessment Data Location of Assessment: [facility] ED ACT Assessment: Yes Living Arrangements: Other (Comment) [facility]) Can pt return to current living arrangement?: Yes Admission Status: Voluntary Is patient capable of signing voluntary admission?: Yes Transfer from: Acute Hospital Referral Source: Self/Family/Friend  Education Status Is patient currently in school?: No  Risk to self Suicidal Ideation: No Suicidal Intent: No Is patient at risk for suicide?: No Suicidal Plan?: No Access to Means: No What has been your use of drugs/alcohol within the last 12 months?: Opiate abuse Previous Attempts/Gestures: No How many times?: 0  Other Self Harm Risks: Reckless/impulsive Triggers for Past Attempts: Unpredictable Intentional Self Injurious Behavior: Damaging Comment - Self Injurious Behavior: SA issues Family Suicide History: No Recent stressful life event(s): Loss (Comment);Conflict (Comment);Recent negative physical changes (Lost many family members this past year) Persecutory voices/beliefs?: No Depression: Yes Depression Symptoms: Isolating;Despondent;Guilt;Loss of interest in usual pleasures;Feeling worthless/self pity Substance abuse history and/or treatment for substance abuse?: Yes Suicide prevention information given to non-admitted patients: Not applicable  Risk to Others Homicidal Ideation: No Thoughts of Harm to Others: No Current Homicidal Intent: No Current Homicidal Plan: No Access to Homicidal Means: No Identified Victim: pt denies History of harm to others?: No Assessment of Violence: None Noted Violent Behavior Description: na - pt calm, cooperative Does patient have access to weapons?: No Criminal Charges Pending?: No Does  patient have a court date: No  Psychosis Hallucinations: None noted Delusions: None noted  Mental Status Report Appear/Hygiene: Disheveled Eye Contact: Fair Motor Activity: Unremarkable Speech: Soft;Slow Level of Consciousness: Alert Mood: Depressed Affect: Apprehensive Anxiety Level: Minimal Thought Processes: Coherent;Relevant Judgement: Unimpaired Orientation: Person;Place;Time;Situation Obsessive Compulsive Thoughts/Behaviors: None  Cognitive Functioning Concentration: Decreased Memory: Recent Intact;Recent Impaired IQ: Average Insight: Fair Impulse Control: Poor Appetite: Fair Weight Loss:  (3-4 lbs) Weight Gain: 0  Sleep: Decreased Total Hours of Sleep:  (Reports "around 5 hours") Vegetative Symptoms: None  ADLScreening [facility] Assessment Services) Patient's cognitive ability adequate to safely complete daily activities?: Yes Patient able to express need for assistance with ADLs?: Yes Independently performs ADLs?: Yes (appropriate for developmental age)  Abuse/Neglect [facility]) Physical Abuse: Denies Verbal Abuse: Denies Sexual Abuse: Denies  Prior Inpatient Therapy Prior Inpatient Therapy: Yes Prior Therapy Dates: 2009 [facility] Reason [facility]: SA   Prior Outpatient Therapy Prior Outpatient Therapy: Yes Prior Therapy Dates: currently [facility] Reason for Treatment: SA and depression  ADL Screening (condition at time of admission) Patient's cognitive ability adequate to safely complete daily activities?: Yes Patient able to express need for assistance with ADLs?: Yes Independently performs ADLs?: Yes (appropriate for developmental age) Weakness of Legs: None Weakness of Arms/Hands: None  Home Assistive Devices/Equipment Home Assistive Devices/Equipment: None    Abuse/Neglect Assessment (Assessment to be complete while patient is alone) Physical Abuse: Denies Verbal Abuse: Denies Sexual  Abuse: Denies Exploitation of patient/patient's resources: Denies Self-Neglect: Denies Values / Beliefs Cultural Requests During Hospitalization: None Spiritual Requests During Hospitalization: None Consults Spiritual Care Consult Needed: No Social Work Consult Needed: No Merchant navy officer (For Healthcare) Advance Directive: Patient does not have advance directive;Patient would not like information Pre-existing out of facility DNR order (yellow form or pink MOST form): No Nutrition Screen- [facility] Adult/WL/AP Patient's home diet: Regular  Additional Information 1:1 In Past 12 Months?: No CIRT Risk: No Elopement Risk: No Does patient have medical clearance?: Yes     Disposition:  Disposition Disposition of Patient: Other dispositions Type of inpatient treatment program: Adult Other disposition(s): Other (Comment) (Pt to be considered for discharge back to [facility] 9/13) Patient referred to: Other (Comment) (May be able to go back to [facility] on 09/12)  On Site Evaluation by:   Reviewed with Physician:  Bryson Ha, Rennis Harding 09/30/2011 11:12 AM

## 2011-09-30 NOTE — ED Notes (Signed)
Pt CBG is 186

## 2011-09-30 NOTE — Progress Notes (Signed)
Pt complaining of midabdominal pain.  He localizes pain to the midabdomen.  Exam shows no tenderness to palpation, no mass.  Rx with Bentyl, Zofran.  He will have to wait a day to go back to [facility]

## 2011-09-30 NOTE — ED Notes (Signed)
Pt lying in bed and watching TV.

## 2011-10-01 ENCOUNTER — Encounter (HOSPITAL_COMMUNITY): Payer: Self-pay | Admitting: Radiology

## 2011-10-01 ENCOUNTER — Emergency Department (HOSPITAL_COMMUNITY): Payer: Non-veteran care

## 2011-10-01 LAB — COMPREHENSIVE METABOLIC PANEL
ALT: 140 U/L — ABNORMAL HIGH (ref 0–53)
Calcium: 8.8 mg/dL (ref 8.4–10.5)
Creatinine, Ser: 0.64 mg/dL (ref 0.50–1.35)
GFR calc Af Amer: >90 mL/min (ref >90)
Glucose, Bld: 151 mg/dL — ABNORMAL HIGH (ref 70–99)
Sodium: 138 mEq/L (ref 135–145)
Total Protein: 7 g/dL (ref 6.0–8.3)

## 2011-10-01 LAB — CBC WITH DIFFERENTIAL/PLATELET
Basophils Absolute: 0 10*3/uL (ref 0.0–0.1)
Eosinophils Absolute: 0.1 10*3/uL (ref 0.0–0.7)
Eosinophils Relative: 3 % (ref 0–5)
MCH: 29.3 pg (ref 26.0–34.0)
MCV: 87.1 fL (ref 78.0–100.0)
Monocytes Absolute: 0.2 10*3/uL (ref 0.1–1.0)
Platelets: 41 10*3/uL — ABNORMAL LOW (ref 150–400)
RDW: 14.9 % (ref 11.5–15.5)

## 2011-10-01 LAB — GLUCOSE, CAPILLARY
Glucose-Capillary: 182 mg/dL — ABNORMAL HIGH (ref 70–99)
Glucose-Capillary: 138 mg/dL — ABNORMAL HIGH (ref 70–99)
Glucose-Capillary: 188 mg/dL — ABNORMAL HIGH (ref 70–99)

## 2011-10-01 MED ORDER — IOHEXOL 300 MG/ML  SOLN
20.0000 mL | INTRAMUSCULAR | Status: AC
  Administered 2011-10-01 (×2): 20 mL via ORAL

## 2011-10-01 MED ORDER — IOHEXOL 300 MG/ML  SOLN
80.0000 mL | Freq: Once | INTRAMUSCULAR | Status: AC | PRN
  Administered 2011-10-01: 80 mL via INTRAVENOUS

## 2011-10-01 MED ORDER — SODIUM CHLORIDE 0.9 % IV SOLN
INTRAVENOUS | Status: DC
  Administered 2011-10-01: 10:00:00 via INTRAVENOUS

## 2011-10-01 MED ORDER — DICYCLOMINE HCL 20 MG PO TABS: 20.0000 mg | ORAL_TABLET | Freq: Four times a day (QID) | ORAL | Status: DC | PRN

## 2011-10-01 NOTE — ED Notes (Signed)
MD at bedside. 

## 2011-10-01 NOTE — ED Provider Notes (Signed)
0915:  61yo M, c/o LLQ abd "pain" and multiple episodes of diarrhea for the past several days. No black or blood in stools.  No N/V.  No diarrhea today.  VSS, NAD, CTA, RRR, abd soft/mild TTP LLQ/no rebound or guarding.  Will re-check labs, CT A/P.  If cleared, may be d/c back to [facility] from ACT standpoint.   1300:  Repeat labs with continued elevated LFT's, but improved from previous 4 days ago.  CT A/P without acute process.  Pt has tol PO well without N/V.  VS remain stable while in the ED.  Will d/c back to [facility].    Laray Anger, DO 10/01/11 1305

## 2011-10-01 NOTE — ED Notes (Signed)
Pt has completed contrast at this time

## 2012-08-01 IMAGING — CR DG CLAVICLE*L*
2 series · 2 of 2 positions shown · non-contrast
Comparison: 03/25/2010.

CLINICAL DATA: History of injury from fall with pain.

LEFT CLAVICLE - 2+ VIEWS

[w clavicle ap left *]
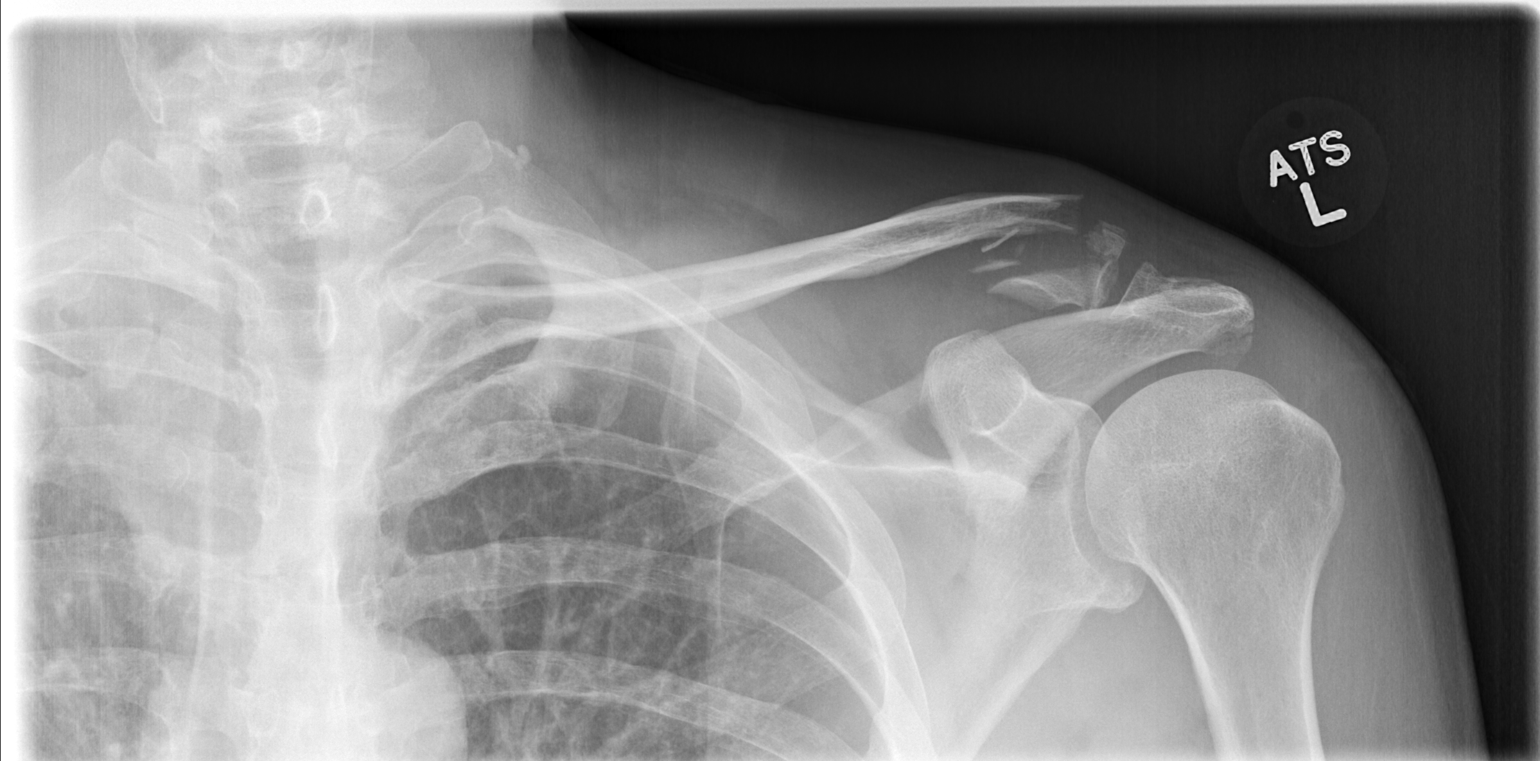

[w clavicle tangential left *]
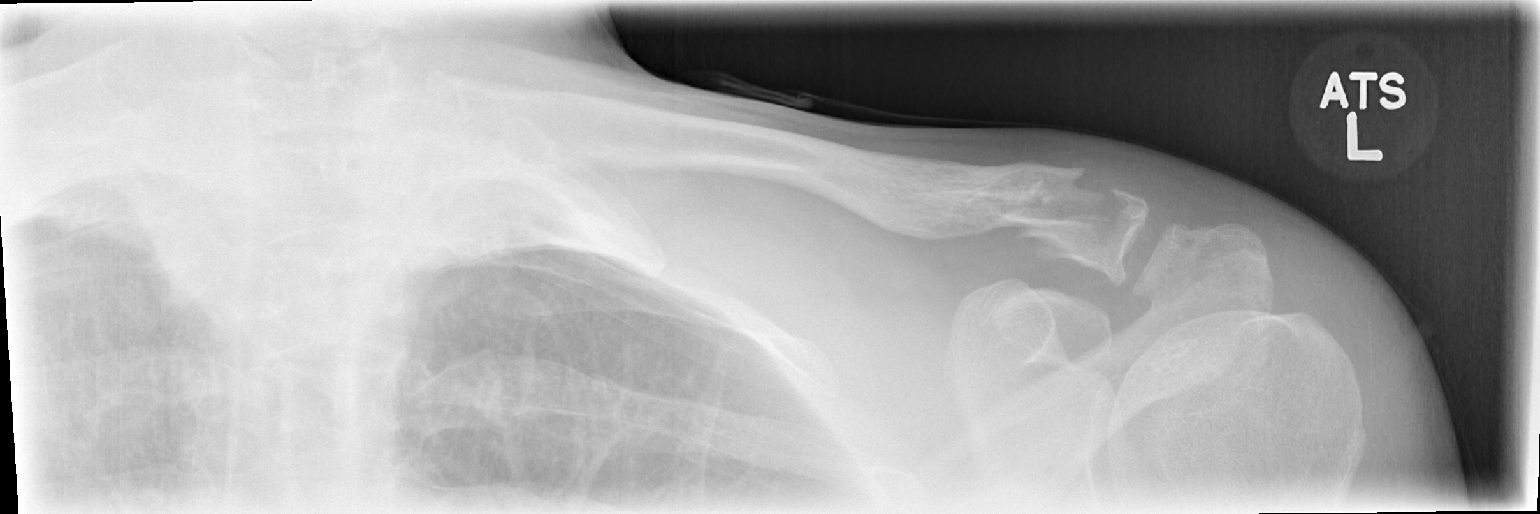

[2 of 2 positions shown; findings below may reference images not displayed]

FINDINGS: There is again evident a comminuted fracture of the
distal clavicle.  There is distal and medial displacement of the
distal fracture fragments.  There is inferior angulation of the
major distal fracture fragment.  No dislocation or disruption of
the AC joint is seen.  The proximal humerus and scapula appear
normal.
IMPRESSION: Comminuted fracture of the distal aspect of left clavicle appears
stable.  No new lesion is evident.

## 2012-08-15 IMAGING — CR DG CHEST 1V PORT
1 series · 1 of 1 positions shown · non-contrast
Comparison: 12/29/2008

CLINICAL DATA: Shortness of breath

PORTABLE CHEST - 1 VIEW

[view not recorded]
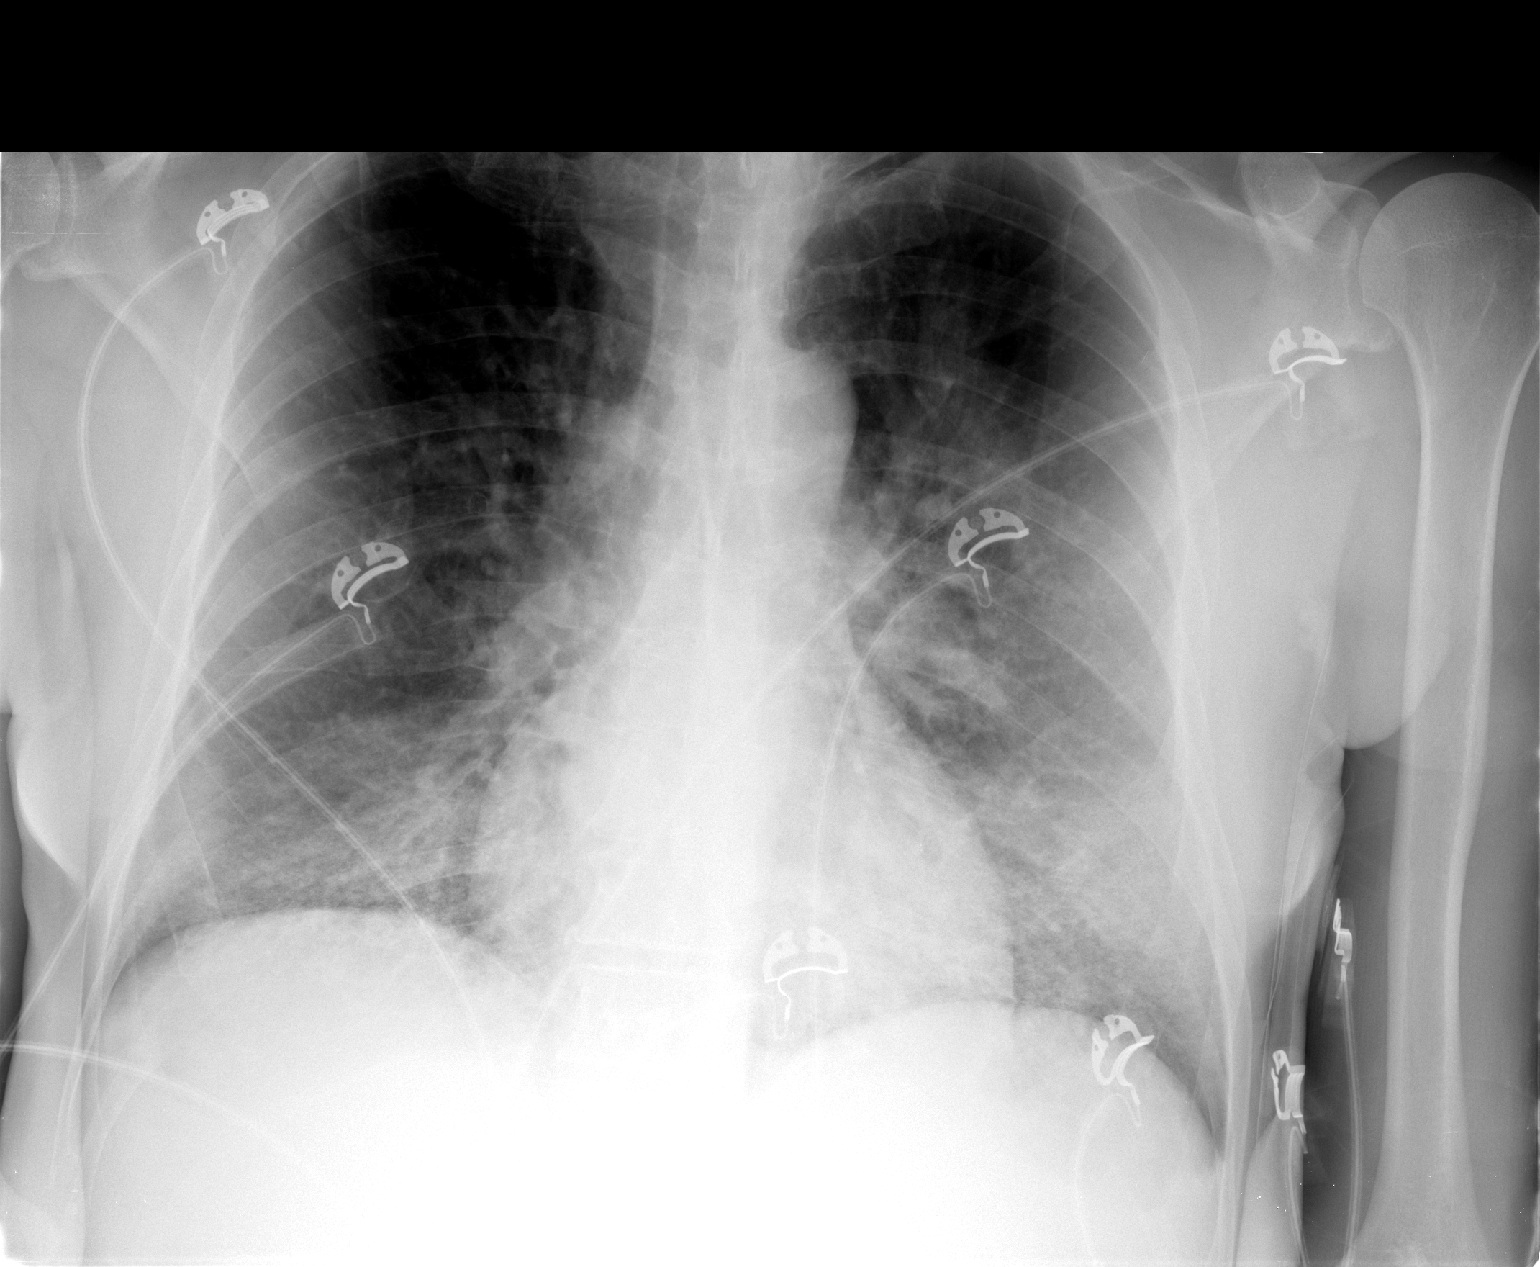

[1 of 1 positions shown; findings below may reference images not displayed]

FINDINGS: Heart size appears normal.

There is no pleural effusion identified.

Diffuse bilateral lower lobe and left perihilar hazy lung opacities
are noted, likely interstitial edema.

Asymmetric airspace opacities noted within the left upper lobe
which may represent an area of asymmetric edema or pneumonia.
IMPRESSION: 1.  Bilateral lower lobe hazy lung opacities consistent with edema.
2.  Left upper lobe opacity may represent area of asymmetric edema
or infection.

## 2012-08-17 IMAGING — CR DG CHEST 1V PORT
1 series · 1 of 1 positions shown · non-contrast
Comparison: 04/21/2010

CLINICAL DATA: Shortness of breath

PORTABLE CHEST - 1 VIEW

[AP]
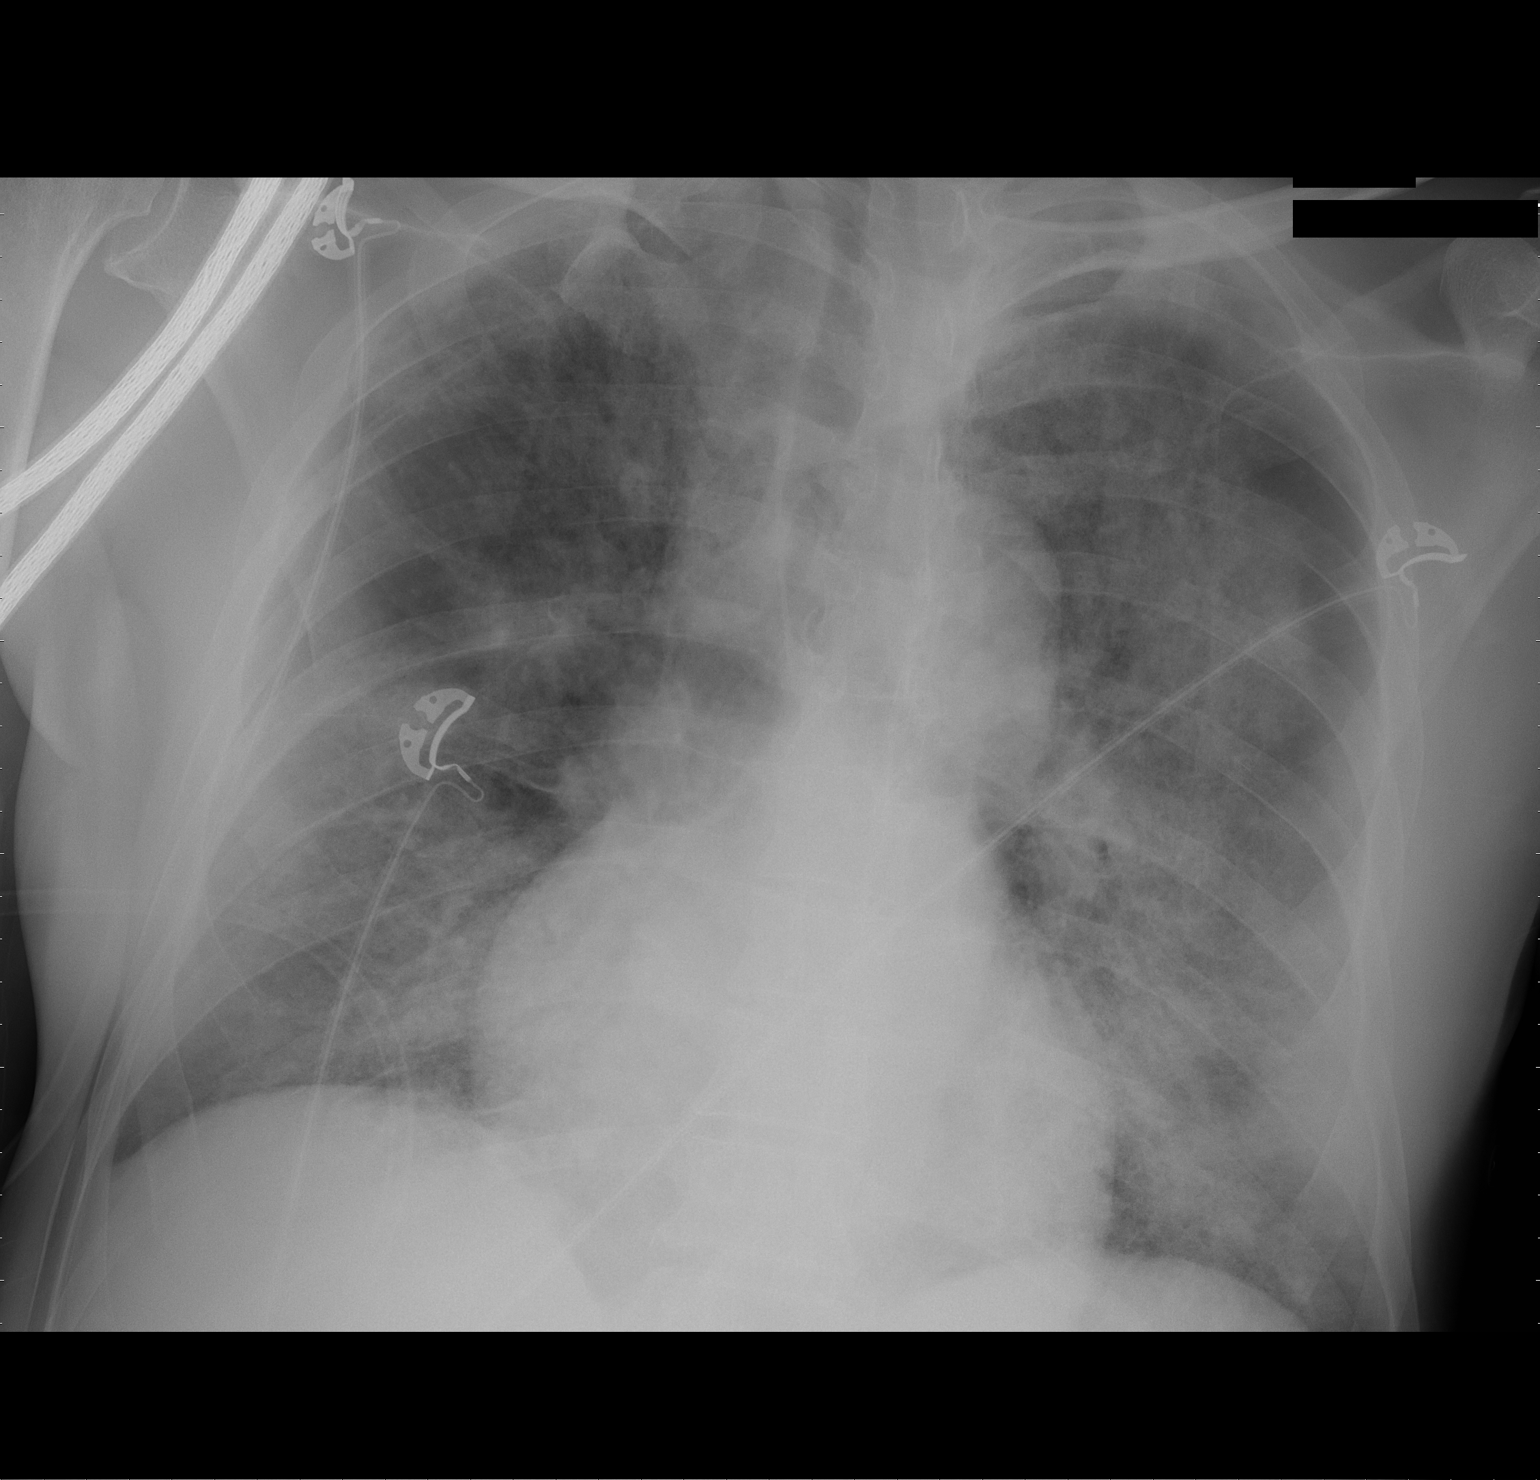

[1 of 1 positions shown; findings below may reference images not displayed]

FINDINGS: Heart size appears normal.

There are small bilateral pleural effusions.

Significant progression of bilateral multifocal airspace opacities.
IMPRESSION: 1.  Significant progression of bilateral multifocal airspace
opacities, likely due to pulmonary edema.

## 2013-02-17 ENCOUNTER — Encounter: Payer: Self-pay | Admitting: *Deleted

## 2013-02-17 DIAGNOSIS — E876 Hypokalemia: Secondary | ICD-10-CM | POA: Insufficient documentation

## 2013-02-17 DIAGNOSIS — R410 Disorientation, unspecified: Secondary | ICD-10-CM | POA: Insufficient documentation

## 2013-02-17 DIAGNOSIS — I10 Essential (primary) hypertension: Secondary | ICD-10-CM | POA: Insufficient documentation

## 2013-02-17 DIAGNOSIS — F1191 Opioid use, unspecified, in remission: Secondary | ICD-10-CM | POA: Insufficient documentation

## 2013-02-17 DIAGNOSIS — J811 Chronic pulmonary edema: Secondary | ICD-10-CM | POA: Insufficient documentation

## 2013-02-17 DIAGNOSIS — Z8719 Personal history of other diseases of the digestive system: Secondary | ICD-10-CM | POA: Insufficient documentation

## 2013-02-17 DIAGNOSIS — Z87898 Personal history of other specified conditions: Secondary | ICD-10-CM | POA: Insufficient documentation

## 2014-10-26 ENCOUNTER — Encounter (HOSPITAL_COMMUNITY): Payer: Self-pay | Admitting: Emergency Medicine

## 2014-10-26 ENCOUNTER — Emergency Department (HOSPITAL_COMMUNITY): Payer: Medicare Other

## 2014-10-26 ENCOUNTER — Emergency Department (HOSPITAL_COMMUNITY)
Admission: EM | Admit: 2014-10-26 | Discharge: 2014-10-26 | Disposition: A | Discharge status: Home / Self Care | Payer: Medicare Other | Attending: Emergency Medicine | Admitting: Emergency Medicine

## 2014-10-26 DIAGNOSIS — Z8719 Personal history of other diseases of the digestive system: Secondary | ICD-10-CM | POA: Diagnosis not present

## 2014-10-26 DIAGNOSIS — L03116 Cellulitis of left lower limb: Secondary | ICD-10-CM

## 2014-10-26 DIAGNOSIS — Z720 Tobacco use: Secondary | ICD-10-CM | POA: Insufficient documentation

## 2014-10-26 DIAGNOSIS — Z794 Long term (current) use of insulin: Secondary | ICD-10-CM | POA: Insufficient documentation

## 2014-10-26 DIAGNOSIS — Y9389 Activity, other specified: Secondary | ICD-10-CM | POA: Insufficient documentation

## 2014-10-26 DIAGNOSIS — E119 Type 2 diabetes mellitus without complications: Secondary | ICD-10-CM | POA: Diagnosis not present

## 2014-10-26 DIAGNOSIS — S8992XA Unspecified injury of left lower leg, initial encounter: Secondary | ICD-10-CM | POA: Diagnosis present

## 2014-10-26 DIAGNOSIS — Y998 Other external cause status: Secondary | ICD-10-CM | POA: Insufficient documentation

## 2014-10-26 DIAGNOSIS — W06XXXA Fall from bed, initial encounter: Secondary | ICD-10-CM | POA: Insufficient documentation

## 2014-10-26 DIAGNOSIS — I10 Essential (primary) hypertension: Secondary | ICD-10-CM | POA: Insufficient documentation

## 2014-10-26 DIAGNOSIS — Y9289 Other specified places as the place of occurrence of the external cause: Secondary | ICD-10-CM | POA: Diagnosis not present

## 2014-10-26 DIAGNOSIS — Z79899 Other long term (current) drug therapy: Secondary | ICD-10-CM | POA: Insufficient documentation

## 2014-10-26 LAB — BASIC METABOLIC PANEL
Anion gap: 6 (ref 5–15)
BUN: <5 mg/dL — ABNORMAL LOW (ref 6–20)
CHLORIDE: 106 mmol/L (ref 101–111)
CO2: 27 mmol/L (ref 22–32)
CREATININE: 0.7 mg/dL (ref 0.61–1.24)
Calcium: 8.4 mg/dL — ABNORMAL LOW (ref 8.9–10.3)
GFR calc non Af Amer: >60 mL/min (ref >60)
Glucose, Bld: 105 mg/dL — ABNORMAL HIGH (ref 65–99)
Potassium: 3.6 mmol/L (ref 3.5–5.1)
SODIUM: 139 mmol/L (ref 135–145)

## 2014-10-26 LAB — CBC WITH DIFFERENTIAL/PLATELET
BASOS ABS: 0 10*3/uL (ref 0.0–0.1)
BASOS PCT: 1 %
EOS ABS: 0.1 10*3/uL (ref 0.0–0.7)
Eosinophils Relative: 3 %
HCT: 40.2 % (ref 39.0–52.0)
HEMOGLOBIN: 13 g/dL (ref 13.0–17.0)
Lymphocytes Relative: 17 %
Lymphs Abs: 0.5 10*3/uL — ABNORMAL LOW (ref 0.7–4.0)
MCH: 28.2 pg (ref 26.0–34.0)
MCHC: 32.3 g/dL (ref 30.0–36.0)
MCV: 87.2 fL (ref 78.0–100.0)
Monocytes Absolute: 0.2 10*3/uL (ref 0.1–1.0)
Monocytes Relative: 7 %
NEUTROS PCT: 71 %
Neutro Abs: 2.1 10*3/uL (ref 1.7–7.7)
Platelets: 32 10*3/uL — ABNORMAL LOW (ref 150–400)
RBC: 4.61 MIL/uL (ref 4.22–5.81)
RDW: 16.3 % — ABNORMAL HIGH (ref 11.5–15.5)
WBC: 3 10*3/uL — AB (ref 4.0–10.5)

## 2014-10-26 MED ORDER — HYDROCODONE-ACETAMINOPHEN 5-325 MG PO TABS
1.0000 | ORAL_TABLET | Freq: Once | ORAL | Status: AC
  Administered 2014-10-26: 1 via ORAL
  Filled 2014-10-26: qty 1

## 2014-10-26 MED ORDER — CLINDAMYCIN HCL 150 MG PO CAPS
600.0000 mg | ORAL_CAPSULE | Freq: Once | ORAL | Status: AC
  Administered 2014-10-26: 600 mg via ORAL
  Filled 2014-10-26: qty 4

## 2014-10-26 MED ORDER — HYDROCODONE-ACETAMINOPHEN 5-325 MG PO TABS: 1.0000 | ORAL_TABLET | Freq: Four times a day (QID) | ORAL | Status: DC | PRN

## 2014-10-26 MED ORDER — CLINDAMYCIN HCL 150 MG PO CAPS: 450.0000 mg | ORAL_CAPSULE | Freq: Three times a day (TID) | ORAL | Status: DC

## 2014-10-26 NOTE — ED Notes (Signed)
Off unit with xray. 

## 2014-10-26 NOTE — Discharge Instructions (Signed)
1. Medications: Clindamycin is your antibiotic, vicodin for pain, usual home medications 2. Treatment: rest, drink plenty of fluids, use warm compresses, keep wound clean with warm soap and water 3. Follow Up: Please followup in the emergency room in 48 hours (Monday) for a wound check; Please return to the ER sooner for fevers, chills, nausea, vomiting, other signs of infection or if the redness extends beyond the purple line    Cellulitis Cellulitis is an infection of the skin and the tissue beneath it. The infected area is usually red and tender. Cellulitis occurs most often in the arms and lower legs.  CAUSES  Cellulitis is caused by bacteria that enter the skin through cracks or cuts in the skin. The most common types of bacteria that cause cellulitis are staphylococci and streptococci. SIGNS AND SYMPTOMS   Redness and warmth.  Swelling.  Tenderness or pain.  Fever. DIAGNOSIS  Your health care provider can usually determine what is wrong based on a physical exam. Blood tests may also be done. TREATMENT  Treatment usually involves taking an antibiotic medicine. HOME CARE INSTRUCTIONS   Take your antibiotic medicine as directed by your health care provider. Finish the antibiotic even if you start to feel better.  Keep the infected arm or leg elevated to reduce swelling.  Apply a warm cloth to the affected area up to 4 times per day to relieve pain.  Take medicines only as directed by your health care provider.  Keep all follow-up visits as directed by your health care provider. SEEK MEDICAL CARE IF:   You notice red streaks coming from the infected area.  Your red area gets larger or turns dark in color.  Your bone or joint underneath the infected area becomes painful after the skin has healed.  Your infection returns in the same area or another area.  You notice a swollen bump in the infected area.  You develop new symptoms.  You have a fever. SEEK IMMEDIATE  MEDICAL CARE IF:   You feel very sleepy.  You develop vomiting or diarrhea.  You have a general ill feeling (malaise) with muscle aches and pains.   This information is not intended to replace advice given to you by your health care provider. Make sure you discuss any questions you have with your health care provider.   Document Released: 10/14/2004 Document Revised: 09/25/2014 Document Reviewed: 03/22/2011 Elsevier Interactive Patient Education [employer].

## 2014-10-26 NOTE — ED Provider Notes (Signed)
The patient is a 64 year old diabetic male who states that he fell out of bed approximately 5 days ago and injured his left anterior shin, he developed a small ulcer with some surrounding redness and has had since increased redness pain and warmth to the skin. On exam he does not appear to be in any distress, he has erythema surrounding a central scab on the left anterior mid shin, this is tender to the touch minimally, there is no subcutaneous emphysema, normal range of motion of the ankle and the knee. Imaging ordered and will be reviewed, evaluate for leukocytosis, imaging to rule out deep tissue infection or osteomyelitis. We'll mark wound borders with a permanent marker, needs follow-up within 48 hours, start antibiotics today.    Medical screening examination/treatment/procedure(s) were conducted as a shared visit with non-physician practitioner(s) and myself.  I personally evaluated the patient during the encounter.  Clinical Impression:   Final diagnoses:  Type 2 diabetes mellitus without complication, with long-term current use of insulin (HCC)  Cellulitis of left anterior lower leg         Sean Chapel, MD 10/27/14 1631

## 2014-10-26 NOTE — ED Notes (Signed)
Pt states that he fell out of bed about 5 days ago and hurt his left leg. Pt reports that he started noticing more pain today without movement. Anterior lower left leg is bruised with a around two centimeter scabbed area in middle of bruise. +pedal pulse. Pt ambulatory. NAD noted.

## 2014-10-26 NOTE — ED Provider Notes (Signed)
CSN: [id number]     Arrival date & time 10/26/14  1531 History  By signing my name below, I, Evelene Croon, attest that this documentation has been prepared under the direction and in the presence of non-physician practitioner, Abigail Butts, PA-C. Electronically Signed: Evelene Croon, Scribe. 10/26/2014. 6:15 PM.    Chief Complaint  Patient presents with  . Fall    The history is provided by the patient. No language interpreter was used.     HPI Comments:  Sean Brull. is a 64 y.o. male with a PMHx of DM, who presents to the Emergency Department s/p fall ~ 5 days ago complaining of progressively worsening pain to his left shin following the incident.  He was reaching for something while in bed, overreached and fell out of bed. He notes he knocked over a food tray in the process and the tray fell on his leg as well causing a bruise to his mid-shin. His pain is exacerbated when bearing weight on the extremity but notes he has been able to ambulate. He has applied bengay to the site without relief. He denies fever, chills, nausea, and vomiting. No alleviating factors noted.   Past Medical History  Diagnosis Date  . Hypertension   . Diabetes mellitus   . Acute delirium   . Pulmonary edema   . Hypokalemia   . History of heroin use   . History of cirrhosis of liver    Past Surgical History  Procedure Laterality Date  . Cholecystectomy    . Shoulder surgery Left    No family history on file. Social History  Substance Use Topics  . Smoking status: Current Every Day Smoker -- 0.30 packs/day for 25 years    Types: Cigarettes  . Smokeless tobacco: None  . Alcohol Use: No    Review of Systems  Constitutional: Negative for fever and chills.  Respiratory: Negative for shortness of breath.   Cardiovascular: Negative for chest pain.  Gastrointestinal: Negative for nausea and vomiting.  Endocrine: Negative for polydipsia, polyphagia and polyuria.  Musculoskeletal:  Positive for myalgias (LLE).  Skin: Positive for color change (erythema) and wound.       Abscess  Allergic/Immunologic: Negative for immunocompromised state.  Neurological: Negative for headaches.  Hematological: Does not bruise/bleed easily.  Psychiatric/Behavioral: The patient is not nervous/anxious.     Allergies  Review of patient's allergies indicates no known allergies.  Home Medications   Prior to Admission medications   Medication Sig Start Date End Date Taking? Authorizing Provider  clindamycin (CLEOCIN) 150 MG capsule Take 3 capsules (450 mg total) by mouth 3 (three) times daily. 10/26/14   Malakie Balis, PA-C  dicyclomine (BENTYL) 20 MG tablet Take 1 tablet (20 mg total) by mouth every 6 (six) hours as needed (abdominal cramping). 10/01/11 09/30/12  Francine Graven, DO  glipiZIDE (GLUCOTROL) 10 MG tablet Take 10 mg by mouth 2 (two) times daily before a meal.    Historical Provider, MD  HYDROcodone-acetaminophen (NORCO/VICODIN) 5-325 MG tablet Take 1 tablet by mouth every 6 (six) hours as needed. 10/26/14   Haleigh Desmith, PA-C  HYDROXYZINE HCL PO Take 1 tablet by mouth 2 (two) times daily as needed. For itching    Historical Provider, MD  ibuprofen (ADVIL,MOTRIN) 200 MG tablet Take 600 mg by mouth every 6 (six) hours as needed. For pain.    Historical Provider, MD  insulin glargine (LANTUS) 100 UNIT/ML injection Inject 90 Units into the skin at bedtime.    Historical Provider,  MD  sertraline (ZOLOFT) 100 MG tablet Take 100 mg by mouth daily.    Historical Provider, MD   BP 161/85 mmHg  Pulse 81  Temp(Src) 97.8 F (36.6 C) (Oral)  Resp 18  Ht 6\' 2"  (1.88 m)  Wt 168 lb 9.6 oz (76.476 kg)  BMI 21.64 kg/m2  SpO2 100% Physical Exam  Constitutional: He is oriented to person, place, and time. He appears well-developed and well-nourished. No distress.  HENT:  Head: Normocephalic and atraumatic.  Eyes: Conjunctivae are normal. No scleral icterus.  Neck: Normal range  of motion.  Cardiovascular: Normal rate, regular rhythm, normal heart sounds and intact distal pulses.   Capillary refill < 3 sec  Pulmonary/Chest: Effort normal and breath sounds normal.  Abdominal: Soft. He exhibits no distension. There is no tenderness.  Musculoskeletal: He exhibits tenderness. He exhibits no edema.  FROM: left knee, left ankle and all toes of the left foot TTP along anterior tibia overlying soft tissue defect with scab in place surrounding erythema and increased warmth with TTP. No palpable fluctuance   Lymphadenopathy:    He has no cervical adenopathy.  Neurological: He is alert and oriented to person, place, and time. Coordination normal.  Sensation intact to LLE Strength 5/5/ in LLE  Skin: Skin is warm and dry. He is not diaphoretic. There is erythema.  No tenting of the skin  Psychiatric: He has a normal mood and affect.  Nursing note and vitals reviewed.   ED Course  Procedures   DIAGNOSTIC STUDIES:  Oxygen Saturation is 100% on RA, normal by my interpretation.    COORDINATION OF CARE:  4:15 PM Discussed treatment plan with pt at bedside and pt agreed to plan.  Labs Review Labs Reviewed  CBC WITH DIFFERENTIAL/PLATELET - Abnormal; Notable for the following:    WBC 3.0 (*)    RDW 16.3 (*)    Platelets 32 (*)    Lymphs Abs 0.5 (*)    All other components within normal limits  BASIC METABOLIC PANEL - Abnormal; Notable for the following:    Glucose, Bld 105 (*)    BUN <5 (*)    Calcium 8.4 (*)    All other components within normal limits    Imaging Review Dg Tibia/fibula Left  10/26/2014   CLINICAL DATA:  Patient with soft tissue infection status post fall. Distal tibia pain.  EXAM: LEFT TIBIA AND FIBULA - 2 VIEW  COMPARISON:  None.  FINDINGS: Normal anatomic alignment. No evidence for acute fracture or dislocation. Regional soft tissues are unremarkable. Vascular calcifications.  IMPRESSION: No acute osseous abnormality.   Electronically Signed    By: Lovey Newcomer M.D.   On: 10/26/2014 17:45   I have personally reviewed and evaluated these images as part of my medical decision-making.   EKG Interpretation None      MDM   Final diagnoses:  Type 2 diabetes mellitus without complication, with long-term current use of insulin (HCC)  Cellulitis of left anterior lower leg   Marlou Sa. presents with wound to the anterior left lower leg x 5 days, now with localized infection.  Increased risk as pt has a hx of IDDM.  Reports normal BS at home.  Will obtain basic labs, x-ray with anticipation of d/c home with oral antibiotics with 48 hr follow-up.    Vitals reassuring without tachycardia, fever or hypotension.    6PM Labs reassuring. Platelets low at baseline. No leukocytosis. Blood glucose 105. No elevated anion gap. No evidence  of DKA. X-rays without evidence of free air or osteomyelitis. Cellulitis Borders marked. Patient will be started on clindamycin. Vicodin given for pain. Patient's primary care is at the [place]. He reports that he will not be able to follow-up in 48 hours. Recommend return visit to the emergency department in 48 hours or sooner for signs of increasing infection.  The patient was discussed with and seen by Sean Beck who agrees with the treatment plan.   I personally performed the services described in this documentation, which was scribed in my presence. The recorded information has been reviewed and is accurate.  BP 161/85 mmHg  Pulse 81  Temp(Src) 97.8 F (36.6 C) (Oral)  Resp 18  Ht 6\' 2"  (1.88 m)  Wt 168 lb 9.6 oz (76.476 kg)  BMI 21.64 kg/m2  SpO2 100%   Abigail Butts, PA-C 10/26/14 1856  Noemi Chapel, MD 10/27/14 [phone]

## 2014-10-28 ENCOUNTER — Emergency Department (HOSPITAL_COMMUNITY)
Admission: EM | Admit: 2014-10-28 | Discharge: 2014-10-28 | Disposition: A | Discharge status: Home / Self Care | Payer: Medicare Other | Attending: Emergency Medicine | Admitting: Emergency Medicine

## 2014-10-28 ENCOUNTER — Encounter (HOSPITAL_COMMUNITY): Payer: Self-pay | Admitting: *Deleted

## 2014-10-28 DIAGNOSIS — Z4801 Encounter for change or removal of surgical wound dressing: Secondary | ICD-10-CM | POA: Diagnosis present

## 2014-10-28 DIAGNOSIS — Z720 Tobacco use: Secondary | ICD-10-CM | POA: Diagnosis not present

## 2014-10-28 DIAGNOSIS — E119 Type 2 diabetes mellitus without complications: Secondary | ICD-10-CM | POA: Insufficient documentation

## 2014-10-28 DIAGNOSIS — L03116 Cellulitis of left lower limb: Secondary | ICD-10-CM

## 2014-10-28 DIAGNOSIS — Z794 Long term (current) use of insulin: Secondary | ICD-10-CM | POA: Insufficient documentation

## 2014-10-28 DIAGNOSIS — I10 Essential (primary) hypertension: Secondary | ICD-10-CM | POA: Diagnosis not present

## 2014-10-28 DIAGNOSIS — Z792 Long term (current) use of antibiotics: Secondary | ICD-10-CM | POA: Insufficient documentation

## 2014-10-28 LAB — CBG MONITORING, ED: Glucose-Capillary: 82 mg/dL (ref 65–99)

## 2014-10-28 MED ORDER — HYDROCODONE-ACETAMINOPHEN 5-325 MG PO TABS: 1.0000 | ORAL_TABLET | Freq: Four times a day (QID) | ORAL | Status: DC | PRN

## 2014-10-28 MED ORDER — HYDROCODONE-ACETAMINOPHEN 5-325 MG PO TABS: 2.0000 | ORAL_TABLET | ORAL | Status: DC | PRN

## 2014-10-28 NOTE — ED Notes (Addendum)
Pt was told to come back today for recheck of cellulitis to left lower leg. Redness may have slightly spread over marked area on leg. Reports still having severe pain, is almost out of pain meds and also doesn't like antibiotics bc its causing him to itch. Denies fever.

## 2014-10-28 NOTE — ED Provider Notes (Signed)
CSN: [id number]     Arrival date & time 10/28/14  1258 History   First MD Initiated Contact with Patient 10/28/14 1612     Chief Complaint  Patient presents with  . Wound Check   HPI   64 year old male presents today for wound check. Mr. Jachim is a 64 year old diabetic male who suffered a laceration to the left anterior shin 10/21/14 . Patient reports surrounding redness was spreading, was seen in the emergency room on 10/26/2014 (2 days ago) diagnosed with cellulitis, DG tib-fib no acute osseous abnormality, labs are reassuring. Patient was discharged home with 6 Vicodin, 10 day course of clindamycin. Patient was instructed to return in 48 hours for recheck, sooner as needed. She reports that they marked the wound, reports initially small amount of redness spreading across the border, but the following day noticed no increase in redness, warmth, swelling. Patient reports symptoms have not progressed, pain has persisted, using oral narcotic pain medication as needed. Patient reports he's been feeling well, denies any fever, chills, shortness of breath, dizziness, fatigue, or any other concerning signs or symptoms at this time. Patient denies any pain to the knee or ankle, full active range of motion of both.  Past Medical History  Diagnosis Date  . Hypertension   . Diabetes mellitus   . Acute delirium   . Pulmonary edema   . Hypokalemia   . History of heroin use   . History of cirrhosis of liver    Past Surgical History  Procedure Laterality Date  . Cholecystectomy    . Shoulder surgery Left    History reviewed. No pertinent family history. Social History  Substance Use Topics  . Smoking status: Current Every Day Smoker -- 0.30 packs/day for 25 years    Types: Cigarettes  . Smokeless tobacco: None  . Alcohol Use: No    Review of Systems  All other systems reviewed and are negative.   Allergies  Review of patient's allergies indicates no known allergies.  Home Medications    Prior to Admission medications   Medication Sig Start Date End Date Taking? Authorizing Provider  clindamycin (CLEOCIN) 150 MG capsule Take 3 capsules (450 mg total) by mouth 3 (three) times daily. 10/26/14  Yes Hannah Muthersbaugh, PA-C  diphenhydrAMINE (SOMINEX) 25 MG tablet Take 25 mg by mouth at bedtime as needed for itching or sleep.   Yes Historical Provider, MD  glipiZIDE (GLUCOTROL) 10 MG tablet Take 10 mg by mouth 2 (two) times daily before a meal.   Yes Historical Provider, MD  ibuprofen (ADVIL,MOTRIN) 200 MG tablet Take 600 mg by mouth every 6 (six) hours as needed. For pain.   Yes Historical Provider, MD  insulin glargine (LANTUS) 100 UNIT/ML injection Inject 80 Units into the skin at bedtime.    Yes Historical Provider, MD  pantoprazole (PROTONIX) 20 MG tablet Take 20 mg by mouth daily as needed for heartburn or indigestion.   Yes Historical Provider, MD  dicyclomine (BENTYL) 20 MG tablet Take 1 tablet (20 mg total) by mouth every 6 (six) hours as needed (abdominal cramping). 10/01/11 09/30/12  Francine Graven, DO  HYDROcodone-acetaminophen (NORCO/VICODIN) 5-325 MG tablet Take 1 tablet by mouth every 6 (six) hours as needed. 10/28/14   Azhar Knope, PA-C   BP 127/95 mmHg  Pulse 95  Temp(Src) 97.9 F (36.6 C) (Oral)  Resp 18  SpO2 100%    Physical Exam  Constitutional: He is oriented to person, place, and time. He appears well-developed and well-nourished.  HENT:  Head: Normocephalic and atraumatic.  Eyes: Conjunctivae are normal. Pupils are equal, round, and reactive to light. Right eye exhibits no discharge. Left eye exhibits no discharge. No scleral icterus.  Neck: Normal range of motion. No JVD present. No tracheal deviation present.  Cardiovascular: Regular rhythm, normal heart sounds and intact distal pulses.   Pulmonary/Chest: Effort normal. No stridor.  Abdominal: Soft. There is no tenderness.  Musculoskeletal: Normal range of motion. He exhibits tenderness. He  exhibits no edema.  Knee and ankle supple with full active range of motion nontender to palpation  Neurological: He is alert and oriented to person, place, and time. Coordination normal.  Skin: Skin is warm and dry. No rash noted. No erythema. No pallor.  Cellulitis to left shin. Area marked with pen at prior visit with approximately 1 cm of erythema extending on the right medial aspect remainder of border with no obvious signs of spreading erythema, warmth, tenderness. No palpable abscess noted.  Psychiatric: He has a normal mood and affect. His behavior is normal. Judgment and thought content normal.  Nursing note and vitals reviewed.   ED Course  Procedures (including critical care time) Labs Review Labs Reviewed  CBG MONITORING, ED    Imaging Review No results found. I have personally reviewed and evaluated these images and lab results as part of my medical decision-making.   EKG Interpretation None      MDM   Final diagnoses:  Cellulitis of left lower extremity    Labs: Point of care CBG- 82  Imaging: Previous x-ray on 10/26/2014 showed no signs of deep space involvement  Consults:  Therapeutics:  Discharge Meds: Continue using clindamycin with the addition of Claritin and Benadryl as needed for itching, hydrocodone for pain  Assessment/Plan: Patient presents for recheck of a wound to the left anterior shin with stranding cellulitis. Patient has minor spreading on one aspect of the marking, this is very minimal and likely due to continued spread before antibiotic efficacy. Patient denies any significant worsening of infection, reports he's been taking the antibiotics, and pain medication as needed. Eyes fever, chills, nausea, vomiting, or any other systemic signs or symptoms. Patient's labs showed no significant findings 2 days ago, no worsening of symptoms no need for further laboratory evaluation in the setting. Patient discharged home with instructions to continue  using clindamycin, and pain medication as needed. He is encouraged follow-up with his primary care provider, if he is unable to follow-up with them and new worsening signs or symptoms present he is encouraged to return immediately to the emergency room. Patient verbalized understanding and agreement for today's plan and assured his follow-up evaluation.         Okey Regal, PA-C 10/28/14 1818  Leo Grosser, MD 10/28/14 [phone]

## 2014-10-28 NOTE — Discharge Instructions (Signed)
Cellulitis Cellulitis is an infection of the skin and the tissue beneath it. The infected area is usually red and tender. Cellulitis occurs most often in the arms and lower legs.  CAUSES  Cellulitis is caused by bacteria that enter the skin through cracks or cuts in the skin. The most common types of bacteria that cause cellulitis are staphylococci and streptococci. SIGNS AND SYMPTOMS   Redness and warmth.  Swelling.  Tenderness or pain.  Fever. DIAGNOSIS  Your health care provider can usually determine what is wrong based on a physical exam. Blood tests may also be done. TREATMENT  Treatment usually involves taking an antibiotic medicine. HOME CARE INSTRUCTIONS   Take your antibiotic medicine as directed by your health care provider. Finish the antibiotic even if you start to feel better.  Keep the infected arm or leg elevated to reduce swelling.  Apply a warm cloth to the affected area up to 4 times per day to relieve pain.  Take medicines only as directed by your health care provider.  Keep all follow-up visits as directed by your health care provider. SEEK MEDICAL CARE IF:   You notice red streaks coming from the infected area.  Your red area gets larger or turns dark in color.  Your bone or joint underneath the infected area becomes painful after the skin has healed.  Your infection returns in the same area or another area.  You notice a swollen bump in the infected area.  You develop new symptoms.  You have a fever. SEEK IMMEDIATE MEDICAL CARE IF:   You feel very sleepy.  You develop vomiting or diarrhea.  You have a general ill feeling (malaise) with muscle aches and pains.   This information is not intended to replace advice given to you by your health care provider. Make sure you discuss any questions you have with your health care provider.   Document Released: 10/14/2004 Document Revised: 09/25/2014 Document Reviewed: 03/22/2011 Elsevier Interactive  Patient Education 2016 [employer].  Please continue using Maalox as directed, please use Claritin as needed for itching. Please use pain medication only as a for breakthrough pain. Please follow-up with your primary care provider in 2 days for reevaluation, please return to the emergency room immediately if new or worsening signs or symptoms present.

## 2015-02-14 ENCOUNTER — Encounter (HOSPITAL_COMMUNITY): Payer: Self-pay | Admitting: Cardiology

## 2015-02-14 ENCOUNTER — Emergency Department (HOSPITAL_COMMUNITY)
Admission: EM | Admit: 2015-02-14 | Discharge: 2015-02-14 | Disposition: A | Discharge status: Home / Self Care | Payer: Medicare Other | Attending: Emergency Medicine | Admitting: Emergency Medicine

## 2015-02-14 DIAGNOSIS — X58XXXA Exposure to other specified factors, initial encounter: Secondary | ICD-10-CM | POA: Diagnosis not present

## 2015-02-14 DIAGNOSIS — Z792 Long term (current) use of antibiotics: Secondary | ICD-10-CM | POA: Insufficient documentation

## 2015-02-14 DIAGNOSIS — I10 Essential (primary) hypertension: Secondary | ICD-10-CM | POA: Insufficient documentation

## 2015-02-14 DIAGNOSIS — Z79899 Other long term (current) drug therapy: Secondary | ICD-10-CM | POA: Diagnosis not present

## 2015-02-14 DIAGNOSIS — Z8709 Personal history of other diseases of the respiratory system: Secondary | ICD-10-CM | POA: Diagnosis not present

## 2015-02-14 DIAGNOSIS — Z794 Long term (current) use of insulin: Secondary | ICD-10-CM | POA: Insufficient documentation

## 2015-02-14 DIAGNOSIS — S90821A Blister (nonthermal), right foot, initial encounter: Secondary | ICD-10-CM | POA: Diagnosis not present

## 2015-02-14 DIAGNOSIS — Y9289 Other specified places as the place of occurrence of the external cause: Secondary | ICD-10-CM | POA: Diagnosis not present

## 2015-02-14 DIAGNOSIS — Y999 Unspecified external cause status: Secondary | ICD-10-CM | POA: Diagnosis not present

## 2015-02-14 DIAGNOSIS — E119 Type 2 diabetes mellitus without complications: Secondary | ICD-10-CM | POA: Diagnosis not present

## 2015-02-14 DIAGNOSIS — L03115 Cellulitis of right lower limb: Secondary | ICD-10-CM | POA: Insufficient documentation

## 2015-02-14 DIAGNOSIS — F17210 Nicotine dependence, cigarettes, uncomplicated: Secondary | ICD-10-CM | POA: Insufficient documentation

## 2015-02-14 DIAGNOSIS — Y9389 Activity, other specified: Secondary | ICD-10-CM | POA: Diagnosis not present

## 2015-02-14 MED ORDER — DOXYCYCLINE HYCLATE 100 MG PO TABS
100.0000 mg | ORAL_TABLET | Freq: Once | ORAL | Status: AC
  Administered 2015-02-14: 100 mg via ORAL
  Filled 2015-02-14: qty 1

## 2015-02-14 MED ORDER — DOXYCYCLINE HYCLATE 100 MG PO CAPS: 100.0000 mg | ORAL_CAPSULE | Freq: Two times a day (BID) | ORAL | Status: DC

## 2015-02-14 MED ORDER — HYDROCODONE-ACETAMINOPHEN 5-325 MG PO TABS: 1.0000 | ORAL_TABLET | Freq: Four times a day (QID) | ORAL | Status: DC | PRN

## 2015-02-14 MED ORDER — HYDROCODONE-ACETAMINOPHEN 5-325 MG PO TABS
1.0000 | ORAL_TABLET | Freq: Once | ORAL | Status: AC
  Administered 2015-02-14: 1 via ORAL
  Filled 2015-02-14: qty 1

## 2015-02-14 NOTE — Discharge Instructions (Signed)
Doxycycline as prescribed until all gone. norco for pain as needed. Keep an eye on redness and swelling. Follow up with primary care doctor for recheck. Return if worsening.    Cellulitis Cellulitis is an infection of the skin and the tissue beneath it. The infected area is usually red and tender. Cellulitis occurs most often in the arms and lower legs.  CAUSES  Cellulitis is caused by bacteria that enter the skin through cracks or cuts in the skin. The most common types of bacteria that cause cellulitis are staphylococci and streptococci. SIGNS AND SYMPTOMS   Redness and warmth.  Swelling.  Tenderness or pain.  Fever. DIAGNOSIS  Your health care provider can usually determine what is wrong based on a physical exam. Blood tests may also be done. TREATMENT  Treatment usually involves taking an antibiotic medicine. HOME CARE INSTRUCTIONS   Take your antibiotic medicine as directed by your health care provider. Finish the antibiotic even if you start to feel better.  Keep the infected arm or leg elevated to reduce swelling.  Apply a warm cloth to the affected area up to 4 times per day to relieve pain.  Take medicines only as directed by your health care provider.  Keep all follow-up visits as directed by your health care provider. SEEK MEDICAL CARE IF:   You notice red streaks coming from the infected area.  Your red area gets larger or turns dark in color.  Your bone or joint underneath the infected area becomes painful after the skin has healed.  Your infection returns in the same area or another area.  You notice a swollen bump in the infected area.  You develop new symptoms.  You have a fever. SEEK IMMEDIATE MEDICAL CARE IF:   You feel very sleepy.  You develop vomiting or diarrhea.  You have a general ill feeling (malaise) with muscle aches and pains.   This information is not intended to replace advice given to you by your health care provider. Make sure you  discuss any questions you have with your health care provider.   Document Released: 10/14/2004 Document Revised: 09/25/2014 Document Reviewed: 03/22/2011 Elsevier Interactive Patient Education 2016 [employer]   Blisters A blister is a fluid-filled sac that forms between layers of skin. Blisters often form in areas where skin rubs against other skin or rubs against something else. The most common areas for blisters are the hands and feet. CAUSES A blister can be caused by:  An injury.  A burn.  An allergic reaction.  An infection.  Exposure to irritating chemicals.  Friction. Friction blisters often result from:  Sports.  Repetitive activities.  Shoes that are too tight or too loose. SIGNS AND SYMPTOMS A blister is often round and looks like a bump. It may itch or be painful to the touch. The liquid in a blister is clear or bloody. Before a blister forms, the skin may become red, feel warm, itch, or be painful to the touch. DIAGNOSIS A blister can usually be diagnosed from its appearance. TREATMENT Treatment involves protecting the area where the blister has formed until the skin has healed. If something is likely to rub against the blister, apply a bandage (dressing) with a hole in the middle over the blister. Most blisters break open, dry up, and go away on their own within 10 days. Rarely, blisters that are very painful may be drained before they break open on their own. Draining of a blister should only be done by a  health care provider under sterile conditions. HOME CARE INSTRUCTIONS  Protect the area where the blister has formed as directed by your health care provider.  Do not open or pop your blister, because it could become infected.  If the blister is very painful, ask your health care provider whether you should have it drained.  If the blister breaks open on its own:  Do not remove the loose skin that is over the blister.  Wash the blister area with soap  and water every day.  After washing the blister area, you may apply an antibiotic cream or ointment and cover the area with a bandage. PREVENTION Taking these steps can help to prevent blisters that are caused by friction:  Wear comfortable shoes that fit well.  Always wear socks with shoes.  Wear extra socks or use tape, bandages, or pads over blister-prone areas as needed.  Wear protective gear, such as gloves, when participating in sports or activities that can cause blisters.  Use powders as needed to keep your feet dry. SEEK MEDICAL CARE IF:  You have increased redness, swelling, or pain in the blister area.  A puslike discharge is coming from the blister area.  You have a fever.  You have chills.   This information is not intended to replace advice given to you by your health care provider. Make sure you discuss any questions you have with your health care provider.   Document Released: 02/12/2004 Document Revised: 01/25/2014 Document Reviewed: 08/04/2013 Elsevier Interactive Patient Education [employer].

## 2015-02-14 NOTE — ED Notes (Signed)
Reports a blister to the inside of the right foot that started about 2 days ago. States he is diabetic and is concerned that the area will get worse. Intact but red.

## 2015-02-14 NOTE — ED Provider Notes (Signed)
CSN: [id number]     Arrival date & time 02/14/15  1427 History  By signing my name below, I, Rayna Sexton, attest that this documentation has been prepared under the direction and in the presence of Darnita Woodrum, PA-C. Electronically Signed: Rayna Sexton, ED Scribe. 02/14/2015. 3:21 PM.    Chief Complaint  Patient presents with  . Wound Check   The history is provided by the patient. No language interpreter was used.    HPI Comments: Sean Nevitt. is a 65 y.o. male with a PMHx including DM, HTN, heroin use and cirrhosis of liver who presents to the Emergency Department complaining of a worsening, moderate, blister to the medial aspect of his right foot with onset 2 days ago. He notes recently adding orthopaedic inserts to a pair of hard soled shoes he was wearing prior to the onset of his current symptoms which he stopped wearing once his symptoms presented. He reports associated, mild, pain when palpating the affected region. He notes taking Extra Strength Tylenol for his pain which provides short term relief. His blood sugar was ~190 on his last reading yesterday. He notes regularly seeing a physician at the [place] in [place] but could not get an appointment for another month. He denies fevers, chills or any other associated symptoms at this time.    Past Medical History  Diagnosis Date  . Hypertension   . Diabetes mellitus   . Acute delirium   . Pulmonary edema   . Hypokalemia   . History of heroin use   . History of cirrhosis of liver    Past Surgical History  Procedure Laterality Date  . Cholecystectomy    . Shoulder surgery Left    History reviewed. No pertinent family history. Social History  Substance Use Topics  . Smoking status: Current Every Day Smoker -- 0.30 packs/day for 25 years    Types: Cigarettes  . Smokeless tobacco: None  . Alcohol Use: No    Review of Systems  Constitutional: Negative for fever and chills.  Skin: Positive for color change and  wound.    Allergies  Review of patient's allergies indicates no known allergies.  Home Medications   Prior to Admission medications   Medication Sig Start Date End Date Taking? Authorizing Provider  clindamycin (CLEOCIN) 150 MG capsule Take 3 capsules (450 mg total) by mouth 3 (three) times daily. 10/26/14   Hannah Muthersbaugh, PA-C  dicyclomine (BENTYL) 20 MG tablet Take 1 tablet (20 mg total) by mouth every 6 (six) hours as needed (abdominal cramping). 10/01/11 09/30/12  Francine Graven, DO  diphenhydrAMINE (SOMINEX) 25 MG tablet Take 25 mg by mouth at bedtime as needed for itching or sleep.    Historical Provider, MD  glipiZIDE (GLUCOTROL) 10 MG tablet Take 10 mg by mouth 2 (two) times daily before a meal.    Historical Provider, MD  HYDROcodone-acetaminophen (NORCO/VICODIN) 5-325 MG tablet Take 1 tablet by mouth every 6 (six) hours as needed. 10/28/14   Okey Regal, PA-C  ibuprofen (ADVIL,MOTRIN) 200 MG tablet Take 600 mg by mouth every 6 (six) hours as needed. For pain.    Historical Provider, MD  insulin glargine (LANTUS) 100 UNIT/ML injection Inject 80 Units into the skin at bedtime.     Historical Provider, MD  pantoprazole (PROTONIX) 20 MG tablet Take 20 mg by mouth daily as needed for heartburn or indigestion.    Historical Provider, MD   BP 159/92 mmHg  Pulse 80  Temp(Src) 97.8 F (36.6 C) (  Oral)  Resp 18  Wt 168 lb (76.204 kg)  SpO2 99%    Physical Exam  Constitutional: He is oriented to person, place, and time. He appears well-developed and well-nourished.  HENT:  Head: Normocephalic and atraumatic.  Eyes: EOM are normal.  Neck: Normal range of motion.  Cardiovascular: Normal rate.   Pulmonary/Chest: Effort normal. No respiratory distress.  Abdominal: Soft.  Musculoskeletal: Normal range of motion.  Neurological: He is alert and oriented to person, place, and time.  Skin: Skin is warm and dry.  2x2cm blister to the right medial foot, just distal to the heel.  There is mild surrounding erythema and swelling. Tender to palpation just over the blister and immediate surrounding area. Normal the rest of the foot and heel. No open skin.  Psychiatric: He has a normal mood and affect.  Nursing note and vitals reviewed.   ED Course  Procedures  DIAGNOSTIC STUDIES: Oxygen Saturation is 99% on RA, normal by my interpretation.    COORDINATION OF CARE: 3:16 PM Discussed treatment plan and return precautions with pt. He understood and is agreeable to the plan.   Labs Review Labs Reviewed - No data to display  Imaging Review No results found.    EKG Interpretation None      MDM   Final diagnoses:  Blister of foot, right, initial encounter  Cellulitis of right foot    patient with a blister which seems to contain clear fluid. There is mild surrounding erythema. Patient is diabetic. States his sugar was 180 yesterday. Most likely a blister from new inserts in his shoes, with some cellulitic changes. Will start on antibiotics, careful wound care, follow-up with primary care doctor for recheck. Instructed to return if worsening. He is afebrile, nontoxic appearing.  Filed Vitals:   02/14/15 1435 02/14/15 1538  BP: 159/92 160/88  Pulse: 80 89  Temp: 97.8 F (36.6 C)   TempSrc: Oral   Resp: 18 16  Weight: 76.204 kg   SpO2: 99% 100%     Sean Senior, PA-C 02/14/15 2236  Wandra Arthurs, MD 02/14/15 2310

## 2015-02-17 ENCOUNTER — Encounter (HOSPITAL_COMMUNITY): Payer: Self-pay | Admitting: Emergency Medicine

## 2015-02-17 ENCOUNTER — Emergency Department (HOSPITAL_COMMUNITY)
Admission: EM | Admit: 2015-02-17 | Discharge: 2015-02-17 | Disposition: A | Discharge status: Home / Self Care | Payer: Medicare Other | Attending: Emergency Medicine | Admitting: Emergency Medicine

## 2015-02-17 DIAGNOSIS — Z8719 Personal history of other diseases of the digestive system: Secondary | ICD-10-CM | POA: Insufficient documentation

## 2015-02-17 DIAGNOSIS — Z794 Long term (current) use of insulin: Secondary | ICD-10-CM | POA: Insufficient documentation

## 2015-02-17 DIAGNOSIS — Z8709 Personal history of other diseases of the respiratory system: Secondary | ICD-10-CM | POA: Diagnosis not present

## 2015-02-17 DIAGNOSIS — F17210 Nicotine dependence, cigarettes, uncomplicated: Secondary | ICD-10-CM | POA: Diagnosis not present

## 2015-02-17 DIAGNOSIS — Z7984 Long term (current) use of oral hypoglycemic drugs: Secondary | ICD-10-CM | POA: Insufficient documentation

## 2015-02-17 DIAGNOSIS — I10 Essential (primary) hypertension: Secondary | ICD-10-CM | POA: Insufficient documentation

## 2015-02-17 DIAGNOSIS — L988 Other specified disorders of the skin and subcutaneous tissue: Secondary | ICD-10-CM | POA: Insufficient documentation

## 2015-02-17 DIAGNOSIS — S90821A Blister (nonthermal), right foot, initial encounter: Secondary | ICD-10-CM

## 2015-02-17 DIAGNOSIS — E119 Type 2 diabetes mellitus without complications: Secondary | ICD-10-CM | POA: Diagnosis not present

## 2015-02-17 DIAGNOSIS — L03119 Cellulitis of unspecified part of limb: Secondary | ICD-10-CM

## 2015-02-17 DIAGNOSIS — Z4800 Encounter for change or removal of nonsurgical wound dressing: Secondary | ICD-10-CM | POA: Diagnosis present

## 2015-02-17 DIAGNOSIS — L03115 Cellulitis of right lower limb: Secondary | ICD-10-CM | POA: Diagnosis not present

## 2015-02-17 LAB — CBG MONITORING, ED: GLUCOSE-CAPILLARY: 158 mg/dL — AB (ref 65–99)

## 2015-02-17 MED ORDER — CLINDAMYCIN HCL 150 MG PO CAPS: 450.0000 mg | ORAL_CAPSULE | Freq: Three times a day (TID) | ORAL | Status: AC

## 2015-02-17 MED ORDER — OXYCODONE-ACETAMINOPHEN 5-325 MG PO TABS
1.0000 | ORAL_TABLET | Freq: Once | ORAL | Status: AC
  Administered 2015-02-17: 1 via ORAL

## 2015-02-17 MED ORDER — OXYCODONE-ACETAMINOPHEN 5-325 MG PO TABS
ORAL_TABLET | ORAL | Status: AC
  Filled 2015-02-17: qty 1

## 2015-02-17 MED ORDER — HYDROCODONE-ACETAMINOPHEN 5-325 MG PO TABS: 1.0000 | ORAL_TABLET | ORAL | Status: DC | PRN

## 2015-02-17 NOTE — Discharge Instructions (Signed)
Blisters A blister is a fluid-filled sac that forms between layers of skin. Blisters often form in areas where skin rubs against other skin or rubs against something else. The most common areas for blisters are the hands and feet. CAUSES A blister can be caused by:  An injury.  A burn.  An allergic reaction.  An infection.  Exposure to irritating chemicals.  Friction. Friction blisters often result from:  Sports.  Repetitive activities.  Shoes that are too tight or too loose. SIGNS AND SYMPTOMS A blister is often round and looks like a bump. It may itch or be painful to the touch. The liquid in a blister is clear or bloody. Before a blister forms, the skin may become red, feel warm, itch, or be painful to the touch. DIAGNOSIS A blister can usually be diagnosed from its appearance. TREATMENT Treatment involves protecting the area where the blister has formed until the skin has healed. If something is likely to rub against the blister, apply a bandage (dressing) with a hole in the middle over the blister. Most blisters break open, dry up, and go away on their own within 10 days. Rarely, blisters that are very painful may be drained before they break open on their own. Draining of a blister should only be done by a health care provider under sterile conditions. HOME CARE INSTRUCTIONS  Protect the area where the blister has formed as directed by your health care provider.  Do not open or pop your blister, because it could become infected.  If the blister is very painful, ask your health care provider whether you should have it drained.  If the blister breaks open on its own:  Do not remove the loose skin that is over the blister.  Wash the blister area with soap and water every day.  After washing the blister area, you may apply an antibiotic cream or ointment and cover the area with a bandage. PREVENTION Taking these steps can help to prevent blisters that are caused by  friction:  Wear comfortable shoes that fit well.  Always wear socks with shoes.  Wear extra socks or use tape, bandages, or pads over blister-prone areas as needed.  Wear protective gear, such as gloves, when participating in sports or activities that can cause blisters.  Use powders as needed to keep your feet dry. SEEK MEDICAL CARE IF:  You have increased redness, swelling, or pain in the blister area.  A puslike discharge is coming from the blister area.  You have a fever.  You have chills.   This information is not intended to replace advice given to you by your health care provider. Make sure you discuss any questions you have with your health care provider.   Document Released: 02/12/2004 Document Revised: 01/25/2014 Document Reviewed: 08/04/2013 Elsevier Interactive Patient Education 2016 Elsevier Inc.  Cellulitis Cellulitis is an infection of the skin and the tissue under the skin. The infected area is usually red and tender. This happens most often in the arms and lower legs. HOME CARE   Take your antibiotic medicine as told. Finish the medicine even if you start to feel better.  Keep the infected arm or leg raised (elevated).  Put a warm cloth on the area up to 4 times per day.  Only take medicines as told by your doctor.  Keep all doctor visits as told. GET HELP IF:  You see red streaks on the skin coming from the infected area.  Your red area gets  bigger or turns a dark color.  Your bone or joint under the infected area is painful after the skin heals.  Your infection comes back in the same area or different area.  You have a puffy (swollen) bump in the infected area.  You have new symptoms.  You have a fever. GET HELP RIGHT AWAY IF:   You feel very sleepy.  You throw up (vomit) or have watery poop (diarrhea).  You feel sick and have muscle aches and pains.   This information is not intended to replace advice given to you by your health care  provider. Make sure you discuss any questions you have with your health care provider.   Document Released: 06/23/2007 Document Revised: 09/25/2014 Document Reviewed: 03/22/2011 Elsevier Interactive Patient Education [employer].

## 2015-02-17 NOTE — ED Notes (Signed)
PT instructed to DC doxy and start clindamycin

## 2015-02-17 NOTE — ED Notes (Signed)
Pt refused to put a gown on when arrived at the room.

## 2015-02-17 NOTE — ED Notes (Signed)
Pt here for increased pain and growing blister to side of right foot; pt seen here for same Friday and given antibiotics

## 2015-02-18 NOTE — ED Provider Notes (Signed)
CSN: [id number]     Arrival date & time 02/17/15  1503 History   First MD Initiated Contact with Patient 02/17/15 1901     Chief Complaint  Patient presents with  . Wound Check     (Consider location/radiation/quality/duration/timing/severity/associated sxs/prior Treatment) HPI   Sean Beck. is a 65 y.o. male with a PMHx including DM, HTN, heroin use and cirrhosis of liver who presents to the Emergency Department complaining of a worsening blister to the medial aspect of his right foot with onset 5 days ago. He notes recently adding orthopaedic inserts to a pair of hard soled shoes he was wearing prior to the onset of his current symptoms which he stopped wearing once his symptoms presented. He reports associated, mild, pain when palpating the affected region.   He was seen on the 27th for this same complaint and at that time he had a 2cm diameter blister on the medial side of his right ankle with mild surrounding erythema and was started on doxy.  He has noted the blister has become larger since then, no fever/chills, he hasn't noticed significant increase in the amount of surrounding erythema.  Past Medical History  Diagnosis Date  . Hypertension   . Diabetes mellitus   . Acute delirium   . Pulmonary edema   . Hypokalemia   . History of heroin use   . History of cirrhosis of liver    Past Surgical History  Procedure Laterality Date  . Cholecystectomy    . Shoulder surgery Left    History reviewed. No pertinent family history. Social History  Substance Use Topics  . Smoking status: Current Every Day Smoker -- 0.30 packs/day for 25 years    Types: Cigarettes  . Smokeless tobacco: None  . Alcohol Use: No    Review of Systems  Constitutional: Negative for fever and chills.  Eyes: Negative for redness.  Respiratory: Negative for cough and shortness of breath.   Cardiovascular: Negative for chest pain.  Gastrointestinal: Negative for nausea, vomiting, abdominal pain  and diarrhea.  Genitourinary: Negative for dysuria.  Skin: Positive for rash and wound.  Neurological: Negative for headaches.  All other systems reviewed and are negative.     Allergies  Review of patient's allergies indicates no known allergies.  Home Medications   Prior to Admission medications   Medication Sig Start Date End Date Taking? Authorizing Provider  clindamycin (CLEOCIN) 150 MG capsule Take 3 capsules (450 mg total) by mouth 3 (three) times daily. 02/17/15 02/24/15  Jarome Matin, MD  dicyclomine (BENTYL) 20 MG tablet Take 1 tablet (20 mg total) by mouth every 6 (six) hours as needed (abdominal cramping). 10/01/11 09/30/12  Francine Graven, DO  diphenhydrAMINE (SOMINEX) 25 MG tablet Take 25 mg by mouth at bedtime as needed for itching or sleep.    Historical Provider, MD  glipiZIDE (GLUCOTROL) 10 MG tablet Take 10 mg by mouth 2 (two) times daily before a meal.    Historical Provider, MD  HYDROcodone-acetaminophen (NORCO/VICODIN) 5-325 MG tablet Take 1 tablet by mouth every 4 (four) hours as needed. 02/17/15   Jarome Matin, MD  ibuprofen (ADVIL,MOTRIN) 200 MG tablet Take 600 mg by mouth every 6 (six) hours as needed. For pain.    Historical Provider, MD  insulin glargine (LANTUS) 100 UNIT/ML injection Inject 80 Units into the skin at bedtime.     Historical Provider, MD  pantoprazole (PROTONIX) 20 MG tablet Take 20 mg by mouth daily as needed for heartburn or indigestion.  Historical Provider, MD   BP 151/90 mmHg  Pulse 92  Temp(Src) 98.7 F (37.1 C) (Oral)  Resp 16  SpO2 99% Physical Exam  Constitutional: He is oriented to person, place, and time. No distress.  HENT:  Head: Normocephalic and atraumatic.  Eyes: EOM are normal. Pupils are equal, round, and reactive to light.  Neck: Normal range of motion. Neck supple.  Cardiovascular: Normal rate.   Pulmonary/Chest: Effort normal. No respiratory distress.  Abdominal: Soft. There is no tenderness.  Musculoskeletal: Normal  range of motion.  4.5 cm tense, serous filled blister on the R medial ankle.  There is mild surrounding erythema without sig ttp, warmth or induration.  Neurological: He is alert and oriented to person, place, and time.  Skin: No rash noted. He is not diaphoretic.  Psychiatric: He has a normal mood and affect.    ED Course  Procedures (including critical care time) Labs Review Labs Reviewed  CBG MONITORING, ED - Abnormal; Notable for the following:    Glucose-Capillary 158 (*)    All other components within normal limits    Imaging Review No results found. I have personally reviewed and evaluated these images and lab results as part of my medical decision-making.   EKG Interpretation None      MDM   Final diagnoses:  Blister of foot, right, initial encounter  Cellulitis of foot    Sean Ly. is a 64 y.o. male with a PMHx including DM, HTN, heroin use and cirrhosis of liver who presents to the Emergency Department complaining of a worsening blister to the medial aspect of his right foot with onset 5 days ago.  He was seen on the 27th for this same problem  The blister now appears larger than at his previous visit.  On exam he has a 4.5 cm serous filled, tense blister over the medial R ankle.  I have unroofed the blister and it was filled with clear yellow serous fluid.  There is mild surrounding erythema.  I will switch him to clinda for possible surrounding cellulitis though the redness doesn't seem to be sig worse than what was seen on 1/27  I have discussed the results, Dx and Tx plan with the pt. They expressed understanding and agree with the plan and were told to return to ED with any worsening of condition or concern.    Disposition: Discharge  Condition: Good  Discharge Medication List as of 02/17/2015  8:17 PM      Follow Up: [facility] [id number] [place]  [place] [phone]  As needed, If symptoms worsen   Pt seen in conjunction with Dr. Denton Ar, MD 02/18/15 Lititz Yao, MD 02/20/15 [phone]

## 2015-03-24 ENCOUNTER — Encounter (HOSPITAL_COMMUNITY): Payer: Self-pay | Admitting: *Deleted

## 2015-03-24 ENCOUNTER — Emergency Department (HOSPITAL_COMMUNITY): Payer: Medicare Other

## 2015-03-24 ENCOUNTER — Inpatient Hospital Stay (HOSPITAL_COMMUNITY)
Admission: EM | Admit: 2015-03-24 | Discharge: 2015-03-28 | DRG: 441 | Disposition: A | Discharge status: Skilled Nursing Facility | Payer: Medicare Other | Attending: Internal Medicine | Admitting: Internal Medicine

## 2015-03-24 ENCOUNTER — Observation Stay (HOSPITAL_COMMUNITY): Payer: Medicare Other

## 2015-03-24 DIAGNOSIS — N179 Acute kidney failure, unspecified: Secondary | ICD-10-CM | POA: Diagnosis not present

## 2015-03-24 DIAGNOSIS — E7220 Disorder of urea cycle metabolism, unspecified: Secondary | ICD-10-CM | POA: Diagnosis not present

## 2015-03-24 DIAGNOSIS — F1010 Alcohol abuse, uncomplicated: Secondary | ICD-10-CM | POA: Diagnosis present

## 2015-03-24 DIAGNOSIS — I471 Supraventricular tachycardia: Secondary | ICD-10-CM | POA: Diagnosis present

## 2015-03-24 DIAGNOSIS — F17210 Nicotine dependence, cigarettes, uncomplicated: Secondary | ICD-10-CM | POA: Diagnosis present

## 2015-03-24 DIAGNOSIS — E119 Type 2 diabetes mellitus without complications: Secondary | ICD-10-CM | POA: Diagnosis present

## 2015-03-24 DIAGNOSIS — Y95 Nosocomial condition: Secondary | ICD-10-CM | POA: Diagnosis present

## 2015-03-24 DIAGNOSIS — K7682 Hepatic encephalopathy: Secondary | ICD-10-CM | POA: Diagnosis present

## 2015-03-24 DIAGNOSIS — R4182 Altered mental status, unspecified: Secondary | ICD-10-CM | POA: Diagnosis not present

## 2015-03-24 DIAGNOSIS — J9601 Acute respiratory failure with hypoxia: Secondary | ICD-10-CM | POA: Diagnosis present

## 2015-03-24 DIAGNOSIS — C229 Malignant neoplasm of liver, not specified as primary or secondary: Secondary | ICD-10-CM | POA: Diagnosis present

## 2015-03-24 DIAGNOSIS — D696 Thrombocytopenia, unspecified: Secondary | ICD-10-CM | POA: Diagnosis present

## 2015-03-24 DIAGNOSIS — E872 Acidosis, unspecified: Secondary | ICD-10-CM | POA: Diagnosis present

## 2015-03-24 DIAGNOSIS — Z8719 Personal history of other diseases of the digestive system: Secondary | ICD-10-CM

## 2015-03-24 DIAGNOSIS — K7460 Unspecified cirrhosis of liver: Secondary | ICD-10-CM | POA: Diagnosis present

## 2015-03-24 DIAGNOSIS — J189 Pneumonia, unspecified organism: Secondary | ICD-10-CM | POA: Diagnosis present

## 2015-03-24 DIAGNOSIS — Z7984 Long term (current) use of oral hypoglycemic drugs: Secondary | ICD-10-CM | POA: Diagnosis not present

## 2015-03-24 DIAGNOSIS — R011 Cardiac murmur, unspecified: Secondary | ICD-10-CM | POA: Diagnosis not present

## 2015-03-24 DIAGNOSIS — R5381 Other malaise: Secondary | ICD-10-CM | POA: Diagnosis not present

## 2015-03-24 DIAGNOSIS — R278 Other lack of coordination: Secondary | ICD-10-CM | POA: Diagnosis present

## 2015-03-24 DIAGNOSIS — Z794 Long term (current) use of insulin: Secondary | ICD-10-CM | POA: Diagnosis not present

## 2015-03-24 DIAGNOSIS — I10 Essential (primary) hypertension: Secondary | ICD-10-CM | POA: Diagnosis present

## 2015-03-24 DIAGNOSIS — F1110 Opioid abuse, uncomplicated: Secondary | ICD-10-CM | POA: Diagnosis present

## 2015-03-24 DIAGNOSIS — K7290 Hepatic failure, unspecified without coma: Principal | ICD-10-CM | POA: Diagnosis present

## 2015-03-24 LAB — COMPREHENSIVE METABOLIC PANEL
ALBUMIN: 2.4 g/dL — AB (ref 3.5–5.0)
ALT: 55 U/L (ref 17–63)
AST: 169 U/L — ABNORMAL HIGH (ref 15–41)
Alkaline Phosphatase: 57 U/L (ref 38–126)
Anion gap: 12 (ref 5–15)
BUN: 35 mg/dL — AB (ref 6–20)
CHLORIDE: 106 mmol/L (ref 101–111)
CO2: 20 mmol/L — AB (ref 22–32)
CREATININE: 1.67 mg/dL — AB (ref 0.61–1.24)
Calcium: 8.1 mg/dL — ABNORMAL LOW (ref 8.9–10.3)
GFR calc Af Amer: 48 mL/min — ABNORMAL LOW (ref >60)
GFR, EST NON AFRICAN AMERICAN: 41 mL/min — AB (ref >60)
Glucose, Bld: 121 mg/dL — ABNORMAL HIGH (ref 65–99)
POTASSIUM: 4.8 mmol/L (ref 3.5–5.1)
SODIUM: 138 mmol/L (ref 135–145)
Total Bilirubin: 1.2 mg/dL (ref 0.3–1.2)
Total Protein: 5.7 g/dL — ABNORMAL LOW (ref 6.5–8.1)

## 2015-03-24 LAB — I-STAT CG4 LACTIC ACID, ED
LACTIC ACID, VENOUS: 2.09 mmol/L — AB (ref 0.5–2.0)
Lactic Acid, Venous: 2.97 mmol/L (ref 0.5–2.0)

## 2015-03-24 LAB — INFLUENZA PANEL BY PCR (TYPE A & B)
H1N1 flu by pcr: NOT DETECTED
Influenza A By PCR: NEGATIVE
Influenza B By PCR: NEGATIVE

## 2015-03-24 LAB — CBC WITH DIFFERENTIAL/PLATELET
Basophils Absolute: 0 10*3/uL (ref 0.0–0.1)
Basophils Relative: 0 %
EOS ABS: 0 10*3/uL (ref 0.0–0.7)
EOS PCT: 0 %
HCT: 33.6 % — ABNORMAL LOW (ref 39.0–52.0)
HEMOGLOBIN: 10.7 g/dL — AB (ref 13.0–17.0)
LYMPHS ABS: 0.6 10*3/uL — AB (ref 0.7–4.0)
LYMPHS PCT: 5 %
MCH: 28.8 pg (ref 26.0–34.0)
MCHC: 31.8 g/dL (ref 30.0–36.0)
MCV: 90.3 fL (ref 78.0–100.0)
MONOS PCT: 8 %
Monocytes Absolute: 1 10*3/uL (ref 0.1–1.0)
Neutro Abs: 10.5 10*3/uL — ABNORMAL HIGH (ref 1.7–7.7)
Neutrophils Relative %: 86 %
PLATELETS: 35 10*3/uL — AB (ref 150–400)
RBC: 3.72 MIL/uL — AB (ref 4.22–5.81)
RDW: 17.4 % — ABNORMAL HIGH (ref 11.5–15.5)
WBC: 12.1 10*3/uL — AB (ref 4.0–10.5)

## 2015-03-24 LAB — URINE MICROSCOPIC-ADD ON

## 2015-03-24 LAB — ETHANOL

## 2015-03-24 LAB — URINALYSIS, ROUTINE W REFLEX MICROSCOPIC
BILIRUBIN URINE: NEGATIVE
GLUCOSE, UA: NEGATIVE mg/dL
Ketones, ur: NEGATIVE mg/dL
Leukocytes, UA: NEGATIVE
Nitrite: NEGATIVE
Protein, ur: >300 mg/dL — AB
SPECIFIC GRAVITY, URINE: 1.02 (ref 1.005–1.030)
pH: 5 (ref 5.0–8.0)

## 2015-03-24 LAB — BRAIN NATRIURETIC PEPTIDE: B NATRIURETIC PEPTIDE 5: 81.1 pg/mL (ref 0.0–100.0)

## 2015-03-24 LAB — PHOSPHORUS: PHOSPHORUS: 6 mg/dL — AB (ref 2.5–4.6)

## 2015-03-24 LAB — RAPID URINE DRUG SCREEN, HOSP PERFORMED
AMPHETAMINES: NOT DETECTED
BARBITURATES: NOT DETECTED
Benzodiazepines: NOT DETECTED
COCAINE: NOT DETECTED
OPIATES: POSITIVE — AB
TETRAHYDROCANNABINOL: NOT DETECTED

## 2015-03-24 LAB — AMMONIA: Ammonia: 84 umol/L — ABNORMAL HIGH (ref 9–35)

## 2015-03-24 LAB — MAGNESIUM: MAGNESIUM: 1.9 mg/dL (ref 1.7–2.4)

## 2015-03-24 MED ORDER — SODIUM CHLORIDE 0.9 % IV BOLUS (SEPSIS)
1000.0000 mL | Freq: Once | INTRAVENOUS | Status: AC
  Administered 2015-03-24: 1000 mL via INTRAVENOUS

## 2015-03-24 MED ORDER — DICYCLOMINE HCL 20 MG PO TABS
20.0000 mg | ORAL_TABLET | Freq: Four times a day (QID) | ORAL | Status: DC | PRN
  Administered 2015-03-26 – 2015-03-27 (×2): 20 mg via ORAL
  Filled 2015-03-24 (×3): qty 1

## 2015-03-24 MED ORDER — PIPERACILLIN-TAZOBACTAM 3.375 G IVPB 30 MIN
3.3750 g | Freq: Once | INTRAVENOUS | Status: AC
  Administered 2015-03-24: 3.375 g via INTRAVENOUS
  Filled 2015-03-24: qty 50

## 2015-03-24 MED ORDER — OXYCODONE HCL 5 MG PO TABS
5.0000 mg | ORAL_TABLET | ORAL | Status: DC | PRN
  Administered 2015-03-25 – 2015-03-26 (×3): 5 mg via ORAL
  Filled 2015-03-24 (×3): qty 1

## 2015-03-24 MED ORDER — VANCOMYCIN HCL 10 G IV SOLR
2000.0000 mg | Freq: Once | INTRAVENOUS | Status: AC
Start: 2015-03-24 — End: 2015-03-25
  Administered 2015-03-24: 2000 mg via INTRAVENOUS
  Filled 2015-03-24: qty 2000

## 2015-03-24 MED ORDER — VANCOMYCIN HCL IN DEXTROSE 1-5 GM/200ML-% IV SOLN: 1000.0000 mg | Freq: Once | INTRAVENOUS | Status: DC

## 2015-03-24 MED ORDER — LACTULOSE 10 GM/15ML PO SOLN
20.0000 g | Freq: Two times a day (BID) | ORAL | Status: DC
  Administered 2015-03-25 – 2015-03-26 (×2): 20 g via ORAL
  Filled 2015-03-24 (×9): qty 30

## 2015-03-24 MED ORDER — VANCOMYCIN HCL IN DEXTROSE 750-5 MG/150ML-% IV SOLN
750.0000 mg | Freq: Two times a day (BID) | INTRAVENOUS | Status: DC
  Administered 2015-03-25 – 2015-03-26 (×3): 750 mg via INTRAVENOUS
  Filled 2015-03-24 (×3): qty 150

## 2015-03-24 MED ORDER — PIPERACILLIN-TAZOBACTAM 3.375 G IVPB
3.3750 g | Freq: Three times a day (TID) | INTRAVENOUS | Status: DC
  Administered 2015-03-25: 3.375 g via INTRAVENOUS
  Filled 2015-03-24: qty 50

## 2015-03-24 NOTE — ED Notes (Signed)
Admitting at bedside 

## 2015-03-24 NOTE — H&P (Signed)
[facility] History and Physical  Sean Beck. [id number] DOB: 1950-05-07 DOA: 03/24/2015  Referring physician: Macarthur Critchley, MD PCP: Pcp Not In System   Chief Complaint: Altered Mental Status.  HPI: Sean Beck. is a 65 y.o. male with below past medical history who is brought to the emergency department due to progressively worse mental status, chills and fever for 3 days.  [place] caregiver and family members, patient has been having weakness, low-grade temperatures and decreased mentation for the past few days. When EMS arrived his initial room air oxygen was 85% and increased to 97% with 4 L of supplemental oxygen via nasal cannula.   When seen, the patient was in no acute distress, but still having some confusion. Workup in the emergency department is significant for new bibasilar infiltrates on chest radiograph and leukocytosis.    Review of Systems:  Unable to review.  Past Medical History  Diagnosis Date  . Hypertension   . Diabetes mellitus   . Acute delirium   . Pulmonary edema   . Hypokalemia   . History of heroin use   . History of cirrhosis of liver    Past Surgical History  Procedure Laterality Date  . Cholecystectomy    . Shoulder surgery Left    Social History:  reports that he has been smoking Cigarettes.  He has a 7.5 pack-year smoking history. He does not have any smokeless tobacco history on file. He reports that he does not drink alcohol or use illicit drugs.  No Known Allergies  History reviewed. No pertinent family history.   Prior to Admission medications   Medication Sig Start Date End Date Taking? Authorizing Provider  dicyclomine (BENTYL) 20 MG tablet Take 1 tablet (20 mg total) by mouth every 6 (six) hours as needed (abdominal cramping). 10/01/11 09/30/12  Francine Graven, DO  diphenhydrAMINE (SOMINEX) 25 MG tablet Take 25 mg by mouth at bedtime as needed for itching or sleep.    Historical Provider, MD    glipiZIDE (GLUCOTROL) 10 MG tablet Take 10 mg by mouth 2 (two) times daily before a meal.    Historical Provider, MD  HYDROcodone-acetaminophen (NORCO/VICODIN) 5-325 MG tablet Take 1 tablet by mouth every 4 (four) hours as needed. 02/17/15   Jarome Matin, MD  ibuprofen (ADVIL,MOTRIN) 200 MG tablet Take 600 mg by mouth every 6 (six) hours as needed. For pain.    Historical Provider, MD  insulin glargine (LANTUS) 100 UNIT/ML injection Inject 80 Units into the skin at bedtime.     Historical Provider, MD  pantoprazole (PROTONIX) 20 MG tablet Take 20 mg by mouth daily as needed for heartburn or indigestion.    Historical Provider, MD   Physical Exam: Filed Vitals:   03/24/15 1800 03/24/15 1829 03/24/15 1900 03/24/15 1915  BP: 104/74 147/66 107/88 105/56  Pulse: 108 105 110 103  Temp:  98.8 F (37.1 C)    TempSrc:  Oral    Resp:  13    Weight:      SpO2: 92% 95% 96% 96%    Wt Readings from Last 3 Encounters:  03/24/15 84.5 kg (186 lb 4.6 oz)  02/14/15 76.204 kg (168 lb)  10/26/14 76.476 kg (168 lb 9.6 oz)    General:  Appears calm and comfortable Eyes: PERRL, normal lids, irises & conjunctiva ENT: grossly normal hearing, lips & tongue, oral mucosa mildly dry Neck: no LAD, masses or thyromegaly Cardiovascular: Tachycardic, positive systolic murmur. LE edema. Telemetry:  Sinus tachycardia.  Respiratory: Decreased respiratory effort with bibasilar Rales. No wheezing or rhonchi. Abdomen: soft, ntnd Skin: no rash or induration seen on limited exam Musculoskeletal: grossly normal tone BUE/BLE Psychiatric: grossly normal mood and affect, speech fluent and appropriate Neurologic: Awake, but sleepy, becomes alert and is currently oriented to name, partially oriented to place and time. grossly non-focal.          Labs on Admission:  Basic Metabolic Panel:  Recent Labs Lab 03/24/15 1708  NA 138  K 4.8  CL 106  CO2 20*  GLUCOSE 121*  BUN 35*  CREATININE 1.67*  CALCIUM 8.1*    Liver Function Tests:  Recent Labs Lab 03/24/15 1708  AST 169*  ALT 55  ALKPHOS 57  BILITOT 1.2  PROT 5.7*  ALBUMIN 2.4*    Recent Labs Lab 03/24/15 1900  AMMONIA 84*   CBC:  Recent Labs Lab 03/24/15 1708  WBC 12.1*  NEUTROABS 10.5*  HGB 10.7*  HCT 33.6*  MCV 90.3  PLT 35*    Radiological Exams on Admission: Dg Chest 2 View  03/24/2015  CLINICAL DATA:  Altered mental status with fever. EXAM: CHEST  2 VIEW COMPARISON:  Radiographs 09/28/2011 and 04/26/2010. FINDINGS: There are lower lung volumes. This likely contributes to interval enlargement of the cardiac silhouette and unfolding of the aorta. There are new bilateral airspace opacities without significant pleural effusion. There is no pneumothorax. The bones appear unchanged. Postsurgical changes are present in the distal left clavicle. IMPRESSION: Lower lung volumes with new bilateral airspace opacities suspicious for pulmonary edema or atypical infection. No significant pleural effusion. Electronically Signed   By: Richardean Sale M.D.   On: 03/24/2015 17:43   Ct Head Wo Contrast  03/24/2015  CLINICAL DATA:  Confusion, altered mental status EXAM: CT HEAD WITHOUT CONTRAST TECHNIQUE: Contiguous axial images were obtained from the base of the skull through the vertex without intravenous contrast. COMPARISON:  07/19/2011, 04/22/2010 FINDINGS: Bilateral anterior inferior frontal lobe encephalomalacia stable. No abnormal attenuation to suggest mass or acute vascular territory infarct. No parenchymal hemorrhage. No extra-axial fluid. No hydrocephalus. Calvarium is intact. Minimal inflammation in the visualized paranasal sinuses. Moderate age-related cortical atrophy with mild low attenuation diffusely in the deep white matter. Evidence of prior left ocular surgery. IMPRESSION: Age-related involutional change with chronic frontal lobe encephalomalacia bilaterally. No acute findings. Electronically Signed   By: Skipper Cliche M.D.    On: 03/24/2015 20:05     Assessment/Plan Principal Problem:   Altered mental status Admit to a stepdown. Start lactulose 10 g by mouth twice a day. Follow-up ammonia level in the morning. Continue treatment of HCAP  Active Problems:   HCAP (healthcare-associated pneumonia) Continue supplemental oxygen. Continue IV antibiotics. Bronchodilators as needed. Follow-up blood cultures and sensitivity.    Hypertension Currently on antihypertensives. Continue low-sodium diet. Monitor blood pressure.    History of cirrhosis of liver   Hyperammonemia (HCC) Monitor LFTs. Lactulose and follow-up ammonia level.    Lactic acidosis Continue treatment of HCAP. Follow-up lactic acid level as needed.    Thrombocytopenia ([place]) I will switch Zosyn to cefepime. Monitor platelets closely.       Code Status: Full code. DVT Prophylaxis: SCDs. Family Communication:  Disposition Plan: Admit to the stepdown for IV antibiotic treatment.  Time spent: Over 70 minutes were used during the process of this admission.   Reubin Milan, MD [facility] Pager [phone].

## 2015-03-24 NOTE — ED Notes (Signed)
Pt is able to eat per Dr.Ortiz

## 2015-03-24 NOTE — ED Provider Notes (Signed)
CSN: [id number]     Arrival date & time 03/24/15  1623 History   First MD Initiated Contact with Patient 03/24/15 1631     Chief Complaint  Patient presents with  . Weakness     (Consider location/radiation/quality/duration/timing/severity/associated sxs/prior Treatment) HPI   Patient is a 65 year old male presenting with altered mental status and fever. Patient lives at a [place] group home. According to another group home member he has been increasingly altered in the last 3 days. Patient's had decreased by mouth intake.    Asian has history of hypertension diabetes and narcotic abuse and cirrhosis..  Patient presenting all times at this hospital for blisters on his feet. [facility]   Level V caveat altered mental status.    Past Medical History  Diagnosis Date  . Hypertension   . Diabetes mellitus   . Acute delirium   . Pulmonary edema   . Hypokalemia   . History of heroin use   . History of cirrhosis of liver    Past Surgical History  Procedure Laterality Date  . Cholecystectomy    . Shoulder surgery Left    No family history on file. Social History  Substance Use Topics  . Smoking status: Current Every Day Smoker -- 0.30 packs/day for 25 years    Types: Cigarettes  . Smokeless tobacco: None  . Alcohol Use: No    Review of Systems  Unable to perform ROS: Mental status change      Allergies  Review of patient's allergies indicates no known allergies.  Home Medications   Prior to Admission medications   Medication Sig Start Date End Date Taking? Authorizing Provider  dicyclomine (BENTYL) 20 MG tablet Take 1 tablet (20 mg total) by mouth every 6 (six) hours as needed (abdominal cramping). 10/01/11 09/30/12  Francine Graven, DO  diphenhydrAMINE (SOMINEX) 25 MG tablet Take 25 mg by mouth at bedtime as needed for itching or sleep.    Historical Provider, MD  glipiZIDE (GLUCOTROL) 10 MG tablet Take 10 mg by mouth 2 (two)  times daily before a meal.    Historical Provider, MD  HYDROcodone-acetaminophen (NORCO/VICODIN) 5-325 MG tablet Take 1 tablet by mouth every 4 (four) hours as needed. 02/17/15   Jarome Matin, MD  ibuprofen (ADVIL,MOTRIN) 200 MG tablet Take 600 mg by mouth every 6 (six) hours as needed. For pain.    Historical Provider, MD  insulin glargine (LANTUS) 100 UNIT/ML injection Inject 80 Units into the skin at bedtime.     Historical Provider, MD  pantoprazole (PROTONIX) 20 MG tablet Take 20 mg by mouth daily as needed for heartburn or indigestion.    Historical Provider, MD   BP 147/66 mmHg  Pulse 105  Temp(Src) 98.8 F (37.1 C) (Oral)  Resp 13  Wt 186 lb 4.6 oz (84.5 kg)  SpO2 95% Physical Exam  Constitutional: He appears well-nourished.  Patient lying in bed with eyes closed.  HENT:  Head: Normocephalic.  Dry mucous membranes.  Eyes: Conjunctivae are normal.  Neck: No tracheal deviation present.  No meningismus  Cardiovascular:  Tachycardia plus murmur.  Pulmonary/Chest: Effort normal. No stridor. No respiratory distress.  Abdominal: Soft. There is no tenderness. There is no guarding.  Musculoskeletal:  Chronic healing wounds on bilateral legs.  Neurological: No cranial nerve deficit.  Patient drowsy, arousable with verbal stimuli.  Skin: Skin is warm and dry. He is not diaphoretic.  Skin appears very dry.  Nursing note and vitals reviewed.  ED Course  Procedures (including critical care time) Labs Review Labs Reviewed  COMPREHENSIVE METABOLIC PANEL - Abnormal; Notable for the following:    CO2 20 (*)    Glucose, Bld 121 (*)    BUN 35 (*)    Creatinine, Ser 1.67 (*)    Calcium 8.1 (*)    Total Protein 5.7 (*)    Albumin 2.4 (*)    AST 169 (*)    GFR calc non Af Amer 41 (*)    GFR calc Af Amer 48 (*)    All other components within normal limits  CBC WITH DIFFERENTIAL/PLATELET - Abnormal; Notable for the following:    WBC 12.1 (*)    RBC 3.72 (*)    Hemoglobin 10.7 (*)     HCT 33.6 (*)    RDW 17.4 (*)    Platelets 35 (*)    Neutro Abs 10.5 (*)    Lymphs Abs 0.6 (*)    All other components within normal limits  URINALYSIS, ROUTINE W REFLEX MICROSCOPIC (NOT AT [facility]) - Abnormal; Notable for the following:    Color, Urine AMBER (*)    APPearance HAZY (*)    Hgb urine dipstick LARGE (*)    Protein, ur >300 (*)    All other components within normal limits  URINE RAPID DRUG SCREEN, HOSP PERFORMED - Abnormal; Notable for the following:    Opiates POSITIVE (*)    All other components within normal limits  URINE MICROSCOPIC-ADD ON - Abnormal; Notable for the following:    Squamous Epithelial / LPF 0-5 (*)    Bacteria, UA RARE (*)    Casts HYALINE CASTS (*)    All other components within normal limits  I-STAT CG4 LACTIC ACID, ED - Abnormal; Notable for the following:    Lactic Acid, Venous 2.97 (*)    All other components within normal limits  CULTURE, BLOOD (ROUTINE X 2)  CULTURE, BLOOD (ROUTINE X 2)  URINE CULTURE  INFLUENZA PANEL BY PCR (TYPE A & B, H1N1)  ETHANOL  AMMONIA  BRAIN NATRIURETIC PEPTIDE  I-STAT CG4 LACTIC ACID, ED    Imaging Review Dg Chest 2 View  03/24/2015  CLINICAL DATA:  Altered mental status with fever. EXAM: CHEST  2 VIEW COMPARISON:  Radiographs 09/28/2011 and 04/26/2010. FINDINGS: There are lower lung volumes. This likely contributes to interval enlargement of the cardiac silhouette and unfolding of the aorta. There are new bilateral airspace opacities without significant pleural effusion. There is no pneumothorax. The bones appear unchanged. Postsurgical changes are present in the distal left clavicle. IMPRESSION: Lower lung volumes with new bilateral airspace opacities suspicious for pulmonary edema or atypical infection. No significant pleural effusion. Electronically Signed   By: Richardean Sale M.D.   On: 03/24/2015 17:43   I have personally reviewed and evaluated these images and lab results as part of my medical  decision-making.   EKG Interpretation None      MDM   Final diagnoses:  Altered mental status, unspecified altered mental status type    Patient is 65 year old male presenting with fever and altered mental status. At times the patient has had a three-day declining fevers and decreasing alertness. Concern for sepsis in this individual. Patient arrived here tachycardic, febrile, with low oxygenation. Concern for infection. Will initiate sepsis protocol.  Patient also has a history of drug abuse and alcohol abuse. We'll get levels today.  CRITICAL CARE Performed by: Gardiner Sleeper Total critical care time: 30 minutes Critical care time was  exclusive of separately billable procedures and treating other patients. Critical care was necessary to treat or prevent imminent or life-threatening deterioration. Critical care was time spent personally by me on the following activities: development of treatment plan with patient and/or surrogate as well as nursing, discussions with consultants, evaluation of patient's response to treatment, examination of patient, obtaining history from patient or surrogate, ordering and performing treatments and interventions, ordering and review of laboratory studies, ordering and review of radiographic studies, pulse oximetry and re-evaluation of patient's condition.   7:12 PM Patient x-ray shows edema versus atypical pneumonia. We'll stop with 1 L fluid given his. BNP is pending. Ammonia is pending. Face patient's family members are here and stated that he is more altered than usual. They also report that he's been going through some type of treatment for liver cancer at the [facility].  Patient  is having altered spells however he is verbal through them so I do not think it is a seizure, could be partial siezure.  I'm most concerned the patient is going to have an elevated ammonia due to his history of cirrhosis along with his  encephalopathy.     Analyah Mcconnon Julio Alm, MD 03/24/15 [phone]

## 2015-03-24 NOTE — ED Notes (Signed)
Pt pulled his left hand IV out while itching himself.  Changed top sheet and gown due to blood

## 2015-03-24 NOTE — ED Notes (Signed)
Patient transported to CT 

## 2015-03-24 NOTE — Progress Notes (Signed)
Pharmacy Antibiotic Note  Sean Beck. is a 65 y.o. male admitted on 03/24/2015 with sepsis.  Pharmacy has been consulted for vancomycin and zosyn dosing.  Plan: Vancomycin 2 g x 1 then 750 mg IV every 12 hours.  Goal trough 15-20 mcg/mL. Zosyn 3.375g IV q8h (4 hour infusion).  Weight: 186 lb 4.6 oz (84.5 kg) (weighed pt on ED stretcher)  Temp (24hrs), Avg:99.3 F (37.4 C), Min:98.8 F (37.1 C), Max:99.8 F (37.7 C)   Recent Labs Lab 03/24/15 1708 03/24/15 1718  WBC 12.1*  --   CREATININE 1.67*  --   LATICACIDVEN  --  2.97*    Estimated Creatinine Clearance: 51.3 mL/min (by C-G formula based on Cr of 1.67).    No Known Allergies  Antimicrobials this admission: Vancomycin 3/6>> Zosyn 3/6>>  Dose adjustments this admission: N/A  Microbiology results: 3/6 Blood x 2 3/6 Urine  Levester Fresh, PharmD, BCPS, [facility] Clinical Pharmacist Pager [phone] 03/24/2015 7:27 PM

## 2015-03-24 NOTE — ED Notes (Signed)
Pt from [facility] home.  Pt has been having generalized weakness and decreasing LOC in the past few days.  Pt has also had 4-5 reported falls today.  Pt has been seen at the [facility] for blisters on his feet.  CBG 178 for ems and spo2 on RA 85% with 4L [place] he increased to 97%.  HR 120 and BP 150/90.  Temporal temp for ems was 102.  Pt is intermittently falling asleep, legs appear swollen.

## 2015-03-25 LAB — COMPREHENSIVE METABOLIC PANEL
ALK PHOS: 51 U/L (ref 38–126)
ALT: 53 U/L (ref 17–63)
AST: 139 U/L — AB (ref 15–41)
Albumin: 2.1 g/dL — ABNORMAL LOW (ref 3.5–5.0)
Anion gap: 7 (ref 5–15)
BUN: 33 mg/dL — AB (ref 6–20)
CALCIUM: 7.6 mg/dL — AB (ref 8.9–10.3)
CO2: 22 mmol/L (ref 22–32)
CREATININE: 1.18 mg/dL (ref 0.61–1.24)
Chloride: 107 mmol/L (ref 101–111)
Glucose, Bld: 166 mg/dL — ABNORMAL HIGH (ref 65–99)
Potassium: 4 mmol/L (ref 3.5–5.1)
Sodium: 136 mmol/L (ref 135–145)
Total Bilirubin: 0.9 mg/dL (ref 0.3–1.2)
Total Protein: 5.4 g/dL — ABNORMAL LOW (ref 6.5–8.1)

## 2015-03-25 LAB — CBC WITH DIFFERENTIAL/PLATELET
BASOS ABS: 0 10*3/uL (ref 0.0–0.1)
BASOS PCT: 0 %
EOS ABS: 0 10*3/uL (ref 0.0–0.7)
Eosinophils Relative: 1 %
HCT: 29.1 % — ABNORMAL LOW (ref 39.0–52.0)
HEMOGLOBIN: 9.9 g/dL — AB (ref 13.0–17.0)
LYMPHS ABS: 0.5 10*3/uL — AB (ref 0.7–4.0)
Lymphocytes Relative: 10 %
MCH: 30.6 pg (ref 26.0–34.0)
MCHC: 34 g/dL (ref 30.0–36.0)
MCV: 89.8 fL (ref 78.0–100.0)
Monocytes Absolute: 0.3 10*3/uL (ref 0.1–1.0)
Monocytes Relative: 6 %
NEUTROS ABS: 4.2 10*3/uL (ref 1.7–7.7)
Neutrophils Relative %: 83 %
Platelets: DECREASED 10*3/uL (ref 150–400)
RBC: 3.24 MIL/uL — AB (ref 4.22–5.81)
RDW: 17.3 % — ABNORMAL HIGH (ref 11.5–15.5)
WBC: 5.1 10*3/uL (ref 4.0–10.5)

## 2015-03-25 LAB — URINE CULTURE: Culture: NO GROWTH

## 2015-03-25 LAB — LEGIONELLA ANTIGEN, URINE

## 2015-03-25 LAB — MRSA PCR SCREENING: MRSA BY PCR: NEGATIVE

## 2015-03-25 LAB — PROTIME-INR
INR: 1.55 — AB (ref 0.00–1.49)
PROTHROMBIN TIME: 18.6 s — AB (ref 11.6–15.2)

## 2015-03-25 LAB — CBG MONITORING, ED: Glucose-Capillary: 207 mg/dL — ABNORMAL HIGH (ref 65–99)

## 2015-03-25 LAB — STREP PNEUMONIAE URINARY ANTIGEN: STREP PNEUMO URINARY ANTIGEN: NEGATIVE

## 2015-03-25 LAB — GLUCOSE, CAPILLARY
GLUCOSE-CAPILLARY: 158 mg/dL — AB (ref 65–99)
Glucose-Capillary: 184 mg/dL — ABNORMAL HIGH (ref 65–99)
Glucose-Capillary: 229 mg/dL — ABNORMAL HIGH (ref 65–99)

## 2015-03-25 LAB — BRAIN NATRIURETIC PEPTIDE: B NATRIURETIC PEPTIDE 5: 93.1 pg/mL (ref 0.0–100.0)

## 2015-03-25 MED ORDER — DEXTROSE 5 % IV SOLN
1.0000 g | Freq: Three times a day (TID) | INTRAVENOUS | Status: DC
  Administered 2015-03-25 – 2015-03-26 (×3): 1 g via INTRAVENOUS
  Filled 2015-03-25 (×4): qty 1

## 2015-03-25 MED ORDER — DIPHENHYDRAMINE HCL 25 MG PO CAPS: 25.0000 mg | ORAL_CAPSULE | Freq: Every evening | ORAL | Status: DC | PRN

## 2015-03-25 MED ORDER — INSULIN GLARGINE 100 UNIT/ML ~~LOC~~ SOLN
30.0000 [IU] | Freq: Every day | SUBCUTANEOUS | Status: DC
  Administered 2015-03-25 – 2015-03-27 (×3): 30 [IU] via SUBCUTANEOUS
  Filled 2015-03-25 (×4): qty 0.3

## 2015-03-25 MED ORDER — INSULIN ASPART 100 UNIT/ML ~~LOC~~ SOLN
0.0000 [IU] | Freq: Three times a day (TID) | SUBCUTANEOUS | Status: DC
  Administered 2015-03-25 – 2015-03-26 (×4): 2 [IU] via SUBCUTANEOUS
  Administered 2015-03-27: 3 [IU] via SUBCUTANEOUS
  Administered 2015-03-27: 1 [IU] via SUBCUTANEOUS
  Administered 2015-03-27: 3 [IU] via SUBCUTANEOUS
  Administered 2015-03-28 (×2): 2 [IU] via SUBCUTANEOUS

## 2015-03-25 MED ORDER — GLIPIZIDE 10 MG PO TABS
10.0000 mg | ORAL_TABLET | Freq: Two times a day (BID) | ORAL | Status: DC
Start: 2015-03-25 — End: 2015-03-25
  Administered 2015-03-25: 10 mg via ORAL
  Filled 2015-03-25: qty 1

## 2015-03-25 MED ORDER — ONDANSETRON HCL 4 MG/2ML IJ SOLN: 4.0000 mg | Freq: Four times a day (QID) | INTRAMUSCULAR | Status: DC | PRN

## 2015-03-25 MED ORDER — CETYLPYRIDINIUM CHLORIDE 0.05 % MT LIQD
7.0000 mL | Freq: Two times a day (BID) | OROMUCOSAL | Status: DC
  Administered 2015-03-25 – 2015-03-28 (×7): 7 mL via OROMUCOSAL

## 2015-03-25 MED ORDER — PANTOPRAZOLE SODIUM 20 MG PO TBEC
20.0000 mg | DELAYED_RELEASE_TABLET | Freq: Every day | ORAL | Status: DC | PRN
  Filled 2015-03-25: qty 1

## 2015-03-25 MED ORDER — ONDANSETRON HCL 4 MG PO TABS: 4.0000 mg | ORAL_TABLET | Freq: Four times a day (QID) | ORAL | Status: DC | PRN

## 2015-03-25 MED ORDER — INSULIN GLARGINE 100 UNIT/ML ~~LOC~~ SOLN
80.0000 [IU] | Freq: Every day | SUBCUTANEOUS | Status: DC
  Filled 2015-03-25: qty 0.8

## 2015-03-25 NOTE — Progress Notes (Signed)
[facility] PROGRESS NOTE  Sean Beck. [id number] DOB: 01/21/1950 DOA: 03/24/2015 PCP: Pcp Not In System  Assessment/Plan: 1-Acute encephalopathy; improving.  Suspect related to hepatic encephalopathy, ammonia elevated, and secondary to infection Continue with lactulose.  IV antibiotics.   Acute hypoxic Respiratory failure.  Chest x ray with pulmonary edema vs atypical infection.  Elevated WBC.  Will continue with IV antibiotics to cover for PNA>  Check BNP, if elevated will order one time dose lasix.   PNA;  Continue with IV antibiotics.   History of cirrhosis of liver Hyperammonemia (HCC) Monitor LFTs. Lactulose and follow-up ammonia level. Repeat ammonia level in am.   Diabetes;;  Will decrease lantus home dose from 80 to 30 units. Adjust as needed.   Lactic acidosis Continue treatment of HCAP. Trending down.   AKI:  Improved. Cr peak to 1.6. It has decrease to 1.1.   Code Status: Full code.  Family Communication: care discussed with patient.  Disposition Plan: Remain inpatient    Consultants:  none  Procedures:  none  Antibiotics:  Cefepime 3-7  Vancomycin 3-7  HPI/Subjective: He is alert, eating breakfast,  Has had BM.  Report SOB  Objective: Filed Vitals:   03/25/15 0600 03/25/15 0810  BP: 125/63 124/71  Pulse: 93 29  Temp:  98.5 F (36.9 C)  Resp: 12 17    Intake/Output Summary (Last 24 hours) at 03/25/15 1332 Last data filed at 03/25/15 1240  Gross per 24 hour  Intake   1060 ml  Output   1050 ml  Net     10 ml   Filed Weights   03/24/15 1644 03/25/15 0334  Weight: 84.5 kg (186 lb 4.6 oz) 85.231 kg (187 lb 14.4 oz)    Exam:   General:  NAD  Cardiovascular: S 1, S 2 RRR  Respiratory: Bilateral crackles,   Abdomen: BS present, soft, nt  Musculoskeletal: plus 2 edema   Data Reviewed: Basic Metabolic Panel:  Recent Labs Lab 03/24/15 1708 03/24/15 2334 03/25/15 0543  NA 138  --  136  K 4.8  --   4.0  CL 106  --  107  CO2 20*  --  22  GLUCOSE 121*  --  166*  BUN 35*  --  33*  CREATININE 1.67*  --  1.18  CALCIUM 8.1*  --  7.6*  MG  --  1.9  --   PHOS  --  6.0*  --    Liver Function Tests:  Recent Labs Lab 03/24/15 1708 03/25/15 0543  AST 169* 139*  ALT 55 53  ALKPHOS 57 51  BILITOT 1.2 0.9  PROT 5.7* 5.4*  ALBUMIN 2.4* 2.1*   No results for input(s): LIPASE, AMYLASE in the last 168 hours.  Recent Labs Lab 03/24/15 1900  AMMONIA 84*   CBC:  Recent Labs Lab 03/24/15 1708 03/25/15 0543  WBC 12.1* 5.1  NEUTROABS 10.5* 4.2  HGB 10.7* 9.9*  HCT 33.6* 29.1*  MCV 90.3 89.8  PLT 35* PLATELET CLUMPS NOTED ON SMEAR, COUNT APPEARS DECREASED   Cardiac Enzymes: No results for input(s): CKTOTAL, CKMB, CKMBINDEX, TROPONINI in the last 168 hours. BNP (last 3 results)  Recent Labs  03/24/15 2145  BNP 81.1    ProBNP (last 3 results) No results for input(s): PROBNP in the last 8760 hours.  CBG:  Recent Labs Lab 03/25/15 0006 03/25/15 1200  GLUCAP 207* 184*    Recent Results (from the past 240 hour(s))  Blood Culture (routine x 2)  Status: None (Preliminary result)   Collection Time: 03/24/15  5:00 PM  Result Value Ref Range Status   Specimen Description BLOOD RIGHT ANTECUBITAL  Final   Special Requests BOTTLES DRAWN AEROBIC AND ANAEROBIC 5CC  Final   Culture NO GROWTH < 24 HOURS  Final   Report Status PENDING  Incomplete  Urine culture     Status: None (Preliminary result)   Collection Time: 03/24/15  5:23 PM  Result Value Ref Range Status   Specimen Description URINE, CATHETERIZED  Final   Special Requests NONE  Final   Culture NO GROWTH < 24 HOURS  Final   Report Status PENDING  Incomplete  Blood Culture (routine x 2)     Status: None (Preliminary result)   Collection Time: 03/24/15  6:10 PM  Result Value Ref Range Status   Specimen Description BLOOD RIGHT HAND  Final   Special Requests BOTTLES DRAWN AEROBIC AND ANAEROBIC 5CC  Final    Culture NO GROWTH < 24 HOURS  Final   Report Status PENDING  Incomplete  MRSA PCR Screening     Status: None   Collection Time: 03/25/15  8:12 AM  Result Value Ref Range Status   MRSA by PCR NEGATIVE NEGATIVE Final    Comment:        The GeneXpert MRSA Assay (FDA approved for NASAL specimens only), is one component of a comprehensive MRSA colonization surveillance program. It is not intended to diagnose MRSA infection nor to guide or monitor treatment for MRSA infections.      Studies: Dg Chest 2 View  03/24/2015  CLINICAL DATA:  Altered mental status with fever. EXAM: CHEST  2 VIEW COMPARISON:  Radiographs 09/28/2011 and 04/26/2010. FINDINGS: There are lower lung volumes. This likely contributes to interval enlargement of the cardiac silhouette and unfolding of the aorta. There are new bilateral airspace opacities without significant pleural effusion. There is no pneumothorax. The bones appear unchanged. Postsurgical changes are present in the distal left clavicle. IMPRESSION: Lower lung volumes with new bilateral airspace opacities suspicious for pulmonary edema or atypical infection. No significant pleural effusion. Electronically Signed   By: Richardean Sale M.D.   On: 03/24/2015 17:43   Ct Head Wo Contrast  03/24/2015  CLINICAL DATA:  Confusion, altered mental status EXAM: CT HEAD WITHOUT CONTRAST TECHNIQUE: Contiguous axial images were obtained from the base of the skull through the vertex without intravenous contrast. COMPARISON:  07/19/2011, 04/22/2010 FINDINGS: Bilateral anterior inferior frontal lobe encephalomalacia stable. No abnormal attenuation to suggest mass or acute vascular territory infarct. No parenchymal hemorrhage. No extra-axial fluid. No hydrocephalus. Calvarium is intact. Minimal inflammation in the visualized paranasal sinuses. Moderate age-related cortical atrophy with mild low attenuation diffusely in the deep white matter. Evidence of prior left ocular surgery.  IMPRESSION: Age-related involutional change with chronic frontal lobe encephalomalacia bilaterally. No acute findings. Electronically Signed   By: Skipper Cliche M.D.   On: 03/24/2015 20:05    Scheduled Meds: . antiseptic oral rinse  7 mL Mouth Rinse BID  . ceFEPime (MAXIPIME) IV  1 g Intravenous Q8H  . insulin aspart  0-9 Units Subcutaneous TID WC  . insulin glargine  80 Units Subcutaneous QHS  . lactulose  20 g Oral BID  . vancomycin  750 mg Intravenous Q12H   Continuous Infusions:   Principal Problem:   Altered mental status Active Problems:   Hypertension   History of cirrhosis of liver   Hyperammonemia (HCC)   Lactic acidosis   Thrombocytopenia (HCC)  HCAP (healthcare-associated pneumonia)    Time spent: 35 minutes.     Niel Hummer A  [facility] Pager [phone]. If 7PM-7AM, please contact night-coverage at [email or web address], password [facility] 03/25/2015, 1:32 PM  LOS: 1 day

## 2015-03-25 NOTE — Care Management Note (Signed)
Case Management Note  Patient Details  Name: Sean Beck. MRN: [id number] Date of Birth: Jan 30, 1950  Subjective/Objective:   Pt lives in [place], requests transfer to [place].  TC to Norina Buzzard, Scientist, research (life sciences), and paperwork faxed per request.                        Expected Discharge Plan:  Acute to Acute Transfer  Discharge planning Services  CM Consult  Status of Service:  In process, will continue to follow  Girard Cooter, RN 03/25/2015, 2:25 PM

## 2015-03-26 DIAGNOSIS — E872 Acidosis: Secondary | ICD-10-CM

## 2015-03-26 LAB — COMPREHENSIVE METABOLIC PANEL
ALBUMIN: 1.9 g/dL — AB (ref 3.5–5.0)
ALT: 46 U/L (ref 17–63)
ANION GAP: 8 (ref 5–15)
AST: 87 U/L — ABNORMAL HIGH (ref 15–41)
Alkaline Phosphatase: 53 U/L (ref 38–126)
BILIRUBIN TOTAL: 0.9 mg/dL (ref 0.3–1.2)
BUN: 20 mg/dL (ref 6–20)
CALCIUM: 7.7 mg/dL — AB (ref 8.9–10.3)
CO2: 21 mmol/L — ABNORMAL LOW (ref 22–32)
Chloride: 110 mmol/L (ref 101–111)
Creatinine, Ser: 0.79 mg/dL (ref 0.61–1.24)
GLUCOSE: 212 mg/dL — AB (ref 65–99)
POTASSIUM: 3.4 mmol/L — AB (ref 3.5–5.1)
Sodium: 139 mmol/L (ref 135–145)
TOTAL PROTEIN: 5.2 g/dL — AB (ref 6.5–8.1)

## 2015-03-26 LAB — CBC WITH DIFFERENTIAL/PLATELET
BASOS ABS: 0 10*3/uL (ref 0.0–0.1)
BASOS PCT: 0 %
Eosinophils Absolute: 0 10*3/uL (ref 0.0–0.7)
Eosinophils Relative: 1 %
HEMATOCRIT: 31.4 % — AB (ref 39.0–52.0)
HEMOGLOBIN: 10 g/dL — AB (ref 13.0–17.0)
LYMPHS PCT: 13 %
Lymphs Abs: 0.5 10*3/uL — ABNORMAL LOW (ref 0.7–4.0)
MCH: 28.3 pg (ref 26.0–34.0)
MCHC: 31.8 g/dL (ref 30.0–36.0)
MCV: 89 fL (ref 78.0–100.0)
Monocytes Absolute: 0.3 10*3/uL (ref 0.1–1.0)
Monocytes Relative: 8 %
NEUTROS PCT: 78 %
Neutro Abs: 2.7 10*3/uL (ref 1.7–7.7)
Platelets: 16 10*3/uL — CL (ref 150–400)
RBC: 3.53 MIL/uL — AB (ref 4.22–5.81)
RDW: 17.2 % — ABNORMAL HIGH (ref 11.5–15.5)
WBC: 3.5 10*3/uL — AB (ref 4.0–10.5)

## 2015-03-26 LAB — GLUCOSE, CAPILLARY
GLUCOSE-CAPILLARY: 159 mg/dL — AB (ref 65–99)
GLUCOSE-CAPILLARY: 175 mg/dL — AB (ref 65–99)
GLUCOSE-CAPILLARY: 159 mg/dL — AB (ref 65–99)

## 2015-03-26 LAB — AMMONIA: Ammonia: 80 umol/L — ABNORMAL HIGH (ref 9–35)

## 2015-03-26 MED ORDER — METOPROLOL TARTRATE 25 MG PO TABS: 25.0000 mg | ORAL_TABLET | Freq: Two times a day (BID) | ORAL | Status: DC

## 2015-03-26 MED ORDER — SPIRONOLACTONE 25 MG PO TABS
12.5000 mg | ORAL_TABLET | Freq: Every day | ORAL | Status: DC
  Administered 2015-03-26 – 2015-03-28 (×3): 12.5 mg via ORAL
  Filled 2015-03-26 (×3): qty 1

## 2015-03-26 MED ORDER — ADENOSINE 6 MG/2ML IV SOLN
INTRAVENOUS | Status: AC
  Administered 2015-03-26: 09:00:00
  Filled 2015-03-26: qty 2

## 2015-03-26 MED ORDER — RIFAXIMIN 550 MG PO TABS
550.0000 mg | ORAL_TABLET | Freq: Three times a day (TID) | ORAL | Status: DC
  Administered 2015-03-26 – 2015-03-28 (×8): 550 mg via ORAL
  Filled 2015-03-26 (×9): qty 1

## 2015-03-26 MED ORDER — HYDROCODONE-ACETAMINOPHEN 5-325 MG PO TABS
1.0000 | ORAL_TABLET | ORAL | Status: DC | PRN
  Administered 2015-03-26 – 2015-03-27 (×6): 1 via ORAL
  Filled 2015-03-26 (×6): qty 1

## 2015-03-26 MED ORDER — SODIUM CHLORIDE 0.9 % IV SOLN
1.5000 g | Freq: Four times a day (QID) | INTRAVENOUS | Status: DC
  Administered 2015-03-26 – 2015-03-27 (×5): 1.5 g via INTRAVENOUS
  Filled 2015-03-26 (×8): qty 1.5

## 2015-03-26 MED ORDER — ADENOSINE 6 MG/2ML IV SOLN: 6.0000 mg | Freq: Once | INTRAVENOUS | Status: AC

## 2015-03-26 MED ORDER — METOPROLOL TARTRATE 25 MG PO TABS
25.0000 mg | ORAL_TABLET | Freq: Two times a day (BID) | ORAL | Status: DC
  Administered 2015-03-26 – 2015-03-28 (×5): 25 mg via ORAL
  Filled 2015-03-26 (×5): qty 1

## 2015-03-26 NOTE — Progress Notes (Addendum)
[facility] PROGRESS NOTE    Progress Note   Sean Beck. [id number] DOB: May 17, 1950 DOA: 03/24/2015 PCP: Pcp Not In System   Brief Narrative:   Sean Beck. is an 65 y.o. male past medical history cirrhosis that comes into the ED for altered mental status.  Assessment/Plan:   Acute encephalopathy/Encephalopathy, hepatic (HCC)/History of cirrhosis of liver: Mildly elevated ammonia, with positive asterixis worsening due to infectious etiology. Continue lactulose, rifaximin for 3-4 bowel movements a day. Obtain records from oncologist as he has a history of hepatocellular carcinoma. Need to start oral chemotherapies.  HCAP (healthcare-associated pneumonia): Has remained afebrile, I am concerned that he might have aspirated or being encephalopathic and with ongoing use of narcotics. Continue empiric antibiotics. Will not increase narcotics. He continues to be afebrile, with Ginger. Antibiotics to Unasyn.  Essential Hypertension: Blood pressure seems to be controlled, he is on no antihypertensive medications at home.  Lactic acidosis Unlikely sepsis, this is likely due to liver dysfunction.  Thrombocytopenia ([place]): Due to cirrhosis. Has been stable continue to monitor.  SVT: Nurse called me an EKG was done that showed SVT between 170 and 180s, he was given a single dose of metoprolol and his SVT resolved. He was started on metoprolol 25 mg by mouth twice a day. History this time he had no chest pain or shortness of breath. I do not feel comfortable transfering the patient with a recent episode of unexplained SVT. Check 2-d echo    DVT Prophylaxis - Lovenox ordered.  Family Communication: none Disposition Plan: Home 2-3 days Code Status:     Code Status Orders        Start     Ordered   03/25/15 0346  Full code   Continuous     03/25/15 0345    Code Status History    Date Active Date Inactive Code Status Order ID Comments User Context   09/27/2011 11:39 AM 10/01/2011  5:27 PM Full Code [id number]  Sean Rice, MD ED        IV Access:    Peripheral IV   Procedures and diagnostic studies:   Dg Chest 2 View  03/24/2015  CLINICAL DATA:  Altered mental status with fever. EXAM: CHEST  2 VIEW COMPARISON:  Radiographs 09/28/2011 and 04/26/2010. FINDINGS: There are lower lung volumes. This likely contributes to interval enlargement of the cardiac silhouette and unfolding of the aorta. There are new bilateral airspace opacities without significant pleural effusion. There is no pneumothorax. The bones appear unchanged. Postsurgical changes are present in the distal left clavicle. IMPRESSION: Lower lung volumes with new bilateral airspace opacities suspicious for pulmonary edema or atypical infection. No significant pleural effusion. Electronically Signed   By: Richardean Sale M.D.   On: 03/24/2015 17:43   Ct Head Wo Contrast  03/24/2015  CLINICAL DATA:  Confusion, altered mental status EXAM: CT HEAD WITHOUT CONTRAST TECHNIQUE: Contiguous axial images were obtained from the base of the skull through the vertex without intravenous contrast. COMPARISON:  07/19/2011, 04/22/2010 FINDINGS: Bilateral anterior inferior frontal lobe encephalomalacia stable. No abnormal attenuation to suggest mass or acute vascular territory infarct. No parenchymal hemorrhage. No extra-axial fluid. No hydrocephalus. Calvarium is intact. Minimal inflammation in the visualized paranasal sinuses. Moderate age-related cortical atrophy with mild low attenuation diffusely in the deep white matter. Evidence of prior left ocular surgery. IMPRESSION: Age-related involutional change with chronic frontal lobe encephalomalacia bilaterally. No acute findings. Electronically Signed   By: Elodia Florence.D.  On: 03/24/2015 20:05     Medical Consultants:    None.  Anti-Infectives:   Anti-infectives    Start     Dose/Rate Route Frequency Ordered Stop   03/25/15 1000   ceFEPIme (MAXIPIME) 1 g in dextrose 5 % 50 mL IVPB     1 g 100 mL/hr over 30 Minutes Intravenous Every 8 hours 03/25/15 0320     03/25/15 0600  vancomycin (VANCOCIN) IVPB 750 mg/150 ml premix     750 mg 150 mL/hr over 60 Minutes Intravenous Every 12 hours 03/24/15 1932     03/25/15 0200  piperacillin-tazobactam (ZOSYN) IVPB 3.375 g  Status:  Discontinued     3.375 g 12.5 mL/hr over 240 Minutes Intravenous Every 8 hours 03/24/15 1932 03/25/15 0320   03/24/15 1715  vancomycin (VANCOCIN) 2,000 mg in sodium chloride 0.9 % 500 mL IVPB     2,000 mg 250 mL/hr over 120 Minutes Intravenous  Once 03/24/15 1707 03/25/15 0025   03/24/15 1645  piperacillin-tazobactam (ZOSYN) IVPB 3.375 g     3.375 g 100 mL/hr over 30 Minutes Intravenous  Once 03/24/15 1637 03/24/15 1913   03/24/15 1645  vancomycin (VANCOCIN) IVPB 1000 mg/200 mL premix  Status:  Discontinued     1,000 mg 200 mL/hr over 60 Minutes Intravenous  Once 03/24/15 1637 03/24/15 1707      Subjective:    Sean Beck. patient is demanding and very rude complaining that his narcotics have to be increased.  Objective:    Filed Vitals:   03/25/15 2000 03/26/15 0021 03/26/15 0200 03/26/15 0400  BP: 145/74 145/70 149/76 136/73  Pulse: 96 94 96   Temp: 98.3 F (36.8 C) 98.5 F (36.9 C)  98.6 F (37 C)  TempSrc: Axillary Axillary  Axillary  Resp: 17 19 19 22   Height:      Weight:      SpO2: 99% 94% 97%     Intake/Output Summary (Last 24 hours) at 03/26/15 0722 Last data filed at 03/25/15 2200  Gross per 24 hour  Intake   1090 ml  Output    800 ml  Net    290 ml   Filed Weights   03/24/15 1644 03/25/15 0334  Weight: 84.5 kg (186 lb 4.6 oz) 85.231 kg (187 lb 14.4 oz)    Exam: Gen:  NAD, awake and oriented 3 with mood Cardiovascular:  RRR, has a murmur in the aortic area. Chest and lungs:   CTAB Abdomen:  Abdomen soft, distended nontender Extremities:  No trace edema.   Data Reviewed:    Labs: Basic  Metabolic Panel:  Recent Labs Lab 03/24/15 1708 03/24/15 2334 03/25/15 0543 03/26/15 0245  NA 138  --  136 139  K 4.8  --  4.0 3.4*  CL 106  --  107 110  CO2 20*  --  22 21*  GLUCOSE 121*  --  166* 212*  BUN 35*  --  33* 20  CREATININE 1.67*  --  1.18 0.79  CALCIUM 8.1*  --  7.6* 7.7*  MG  --  1.9  --   --   PHOS  --  6.0*  --   --    GFR Estimated Creatinine Clearance: 107 mL/min (by C-G formula based on Cr of 0.79). Liver Function Tests:  Recent Labs Lab 03/24/15 1708 03/25/15 0543 03/26/15 0245  AST 169* 139* 87*  ALT 55 53 46  ALKPHOS 57 51 53  BILITOT 1.2 0.9 0.9  PROT 5.7* 5.4*  5.2*  ALBUMIN 2.4* 2.1* 1.9*   No results for input(s): LIPASE, AMYLASE in the last 168 hours.  Recent Labs Lab 03/24/15 1900 03/26/15 0245  AMMONIA 84* 80*   Coagulation profile  Recent Labs Lab 03/25/15 0543  INR 1.55*    CBC:  Recent Labs Lab 03/24/15 1708 03/25/15 0543 03/26/15 0245  WBC 12.1* 5.1 3.5*  NEUTROABS 10.5* 4.2 2.7  HGB 10.7* 9.9* 10.0*  HCT 33.6* 29.1* 31.4*  MCV 90.3 89.8 89.0  PLT 35* PLATELET CLUMPS NOTED ON SMEAR, COUNT APPEARS DECREASED 16*   Cardiac Enzymes: No results for input(s): CKTOTAL, CKMB, CKMBINDEX, TROPONINI in the last 168 hours. BNP (last 3 results) No results for input(s): PROBNP in the last 8760 hours. CBG:  Recent Labs Lab 03/25/15 0006 03/25/15 1200 03/25/15 1610 03/25/15 2051  GLUCAP 207* 184* 158* 229*   D-Dimer: No results for input(s): DDIMER in the last 72 hours. Hgb A1c: No results for input(s): HGBA1C in the last 72 hours. Lipid Profile: No results for input(s): CHOL, HDL, LDLCALC, TRIG, CHOLHDL, LDLDIRECT in the last 72 hours. Thyroid function studies: No results for input(s): TSH, T4TOTAL, T3FREE, THYROIDAB in the last 72 hours.  Invalid input(s): FREET3 Anemia work up: No results for input(s): VITAMINB12, FOLATE, FERRITIN, TIBC, IRON, RETICCTPCT in the last 72 hours. Sepsis Labs:  Recent Labs Lab  03/24/15 1708 03/24/15 1718 03/24/15 2144 03/25/15 0543 03/26/15 0245  WBC 12.1*  --   --  5.1 3.5*  LATICACIDVEN  --  2.97* 2.09*  --   --    Microbiology Recent Results (from the past 240 hour(s))  Blood Culture (routine x 2)     Status: None (Preliminary result)   Collection Time: 03/24/15  5:00 PM  Result Value Ref Range Status   Specimen Description BLOOD RIGHT ANTECUBITAL  Final   Special Requests BOTTLES DRAWN AEROBIC AND ANAEROBIC 5CC  Final   Culture NO GROWTH < 24 HOURS  Final   Report Status PENDING  Incomplete  Urine culture     Status: None   Collection Time: 03/24/15  5:23 PM  Result Value Ref Range Status   Specimen Description URINE, CATHETERIZED  Final   Special Requests NONE  Final   Culture NO GROWTH 1 DAY  Final   Report Status 03/25/2015 FINAL  Final  Blood Culture (routine x 2)     Status: None (Preliminary result)   Collection Time: 03/24/15  6:10 PM  Result Value Ref Range Status   Specimen Description BLOOD RIGHT HAND  Final   Special Requests BOTTLES DRAWN AEROBIC AND ANAEROBIC 5CC  Final   Culture NO GROWTH < 24 HOURS  Final   Report Status PENDING  Incomplete  MRSA PCR Screening     Status: None   Collection Time: 03/25/15  8:12 AM  Result Value Ref Range Status   MRSA by PCR NEGATIVE NEGATIVE Final    Comment:        The GeneXpert MRSA Assay (FDA approved for NASAL specimens only), is one component of a comprehensive MRSA colonization surveillance program. It is not intended to diagnose MRSA infection nor to guide or monitor treatment for MRSA infections.      Medications:   . antiseptic oral rinse  7 mL Mouth Rinse BID  . ceFEPime (MAXIPIME) IV  1 g Intravenous Q8H  . insulin aspart  0-9 Units Subcutaneous TID WC  . insulin glargine  30 Units Subcutaneous QHS  . lactulose  20 g Oral BID  .  vancomycin  750 mg Intravenous Q12H   Continuous Infusions:   Time spent: 25 min   LOS: 2 days   Charlynne Cousins  [facility] Pager [phone]  *Please refer to [email or web address], password [email or web address] to get updated schedule on who will round on this patient, as hospitalists switch teams weekly. If 7PM-7AM, please contact night-coverage at [email or web address], password [email or web address] for any overnight needs.  03/26/2015, 7:22 AM

## 2015-03-26 NOTE — Progress Notes (Signed)
Pt refused 2200 dose of lactulose. Stated that "night shift is not adequately staffed to be able to take him to the bathroom as often as he will need to go". Importance of the lactulose was explained and he said he would take it in the morning.

## 2015-03-26 NOTE — Progress Notes (Signed)
Noted HR 180s sustained, Patient Asymptomatic bp 159/75 MD informed via Text page.EKG ordered stat.

## 2015-03-26 NOTE — Progress Notes (Addendum)
EKG shows SVT patient remains asymptomatic, DR Olevia Bowens came to see patient.Ordered to give Adenosine 6mg  given IV per ACLS protocol. Rapid response Nurse Langley Gauss came to assist. Adenosine given and patient converted to SR/ST.

## 2015-03-27 ENCOUNTER — Other Ambulatory Visit (HOSPITAL_COMMUNITY): Payer: Non-veteran care

## 2015-03-27 DIAGNOSIS — J189 Pneumonia, unspecified organism: Secondary | ICD-10-CM

## 2015-03-27 DIAGNOSIS — K7290 Hepatic failure, unspecified without coma: Principal | ICD-10-CM

## 2015-03-27 DIAGNOSIS — R5381 Other malaise: Secondary | ICD-10-CM

## 2015-03-27 DIAGNOSIS — Z8719 Personal history of other diseases of the digestive system: Secondary | ICD-10-CM

## 2015-03-27 LAB — CBC WITH DIFFERENTIAL/PLATELET
Basophils Absolute: 0 10*3/uL (ref 0.0–0.1)
Basophils Relative: 1 %
Eosinophils Absolute: 0 10*3/uL (ref 0.0–0.7)
Eosinophils Relative: 1 %
HEMATOCRIT: 30.2 % — AB (ref 39.0–52.0)
Hemoglobin: 10.2 g/dL — ABNORMAL LOW (ref 13.0–17.0)
LYMPHS ABS: 0.4 10*3/uL — AB (ref 0.7–4.0)
LYMPHS PCT: 11 %
MCH: 30.4 pg (ref 26.0–34.0)
MCHC: 33.8 g/dL (ref 30.0–36.0)
MCV: 89.9 fL (ref 78.0–100.0)
MONO ABS: 0.4 10*3/uL (ref 0.1–1.0)
MONOS PCT: 10 %
NEUTROS ABS: 3.1 10*3/uL (ref 1.7–7.7)
Neutrophils Relative %: 78 %
Platelets: 18 10*3/uL — CL (ref 150–400)
RBC: 3.36 MIL/uL — ABNORMAL LOW (ref 4.22–5.81)
RDW: 17.3 % — AB (ref 11.5–15.5)
WBC: 4 10*3/uL (ref 4.0–10.5)

## 2015-03-27 LAB — COMPREHENSIVE METABOLIC PANEL
ALBUMIN: 1.8 g/dL — AB (ref 3.5–5.0)
ALT: 46 U/L (ref 17–63)
ANION GAP: 7 (ref 5–15)
AST: 65 U/L — AB (ref 15–41)
Alkaline Phosphatase: 49 U/L (ref 38–126)
BUN: 14 mg/dL (ref 6–20)
CO2: 22 mmol/L (ref 22–32)
Calcium: 7.5 mg/dL — ABNORMAL LOW (ref 8.9–10.3)
Chloride: 112 mmol/L — ABNORMAL HIGH (ref 101–111)
Creatinine, Ser: 0.73 mg/dL (ref 0.61–1.24)
GFR calc Af Amer: >60 mL/min (ref >60)
GFR calc non Af Amer: >60 mL/min (ref >60)
GLUCOSE: 135 mg/dL — AB (ref 65–99)
POTASSIUM: 3.1 mmol/L — AB (ref 3.5–5.1)
SODIUM: 141 mmol/L (ref 135–145)
Total Bilirubin: 1.5 mg/dL — ABNORMAL HIGH (ref 0.3–1.2)
Total Protein: 5.2 g/dL — ABNORMAL LOW (ref 6.5–8.1)

## 2015-03-27 LAB — GLUCOSE, CAPILLARY
Glucose-Capillary: 144 mg/dL — ABNORMAL HIGH (ref 65–99)
Glucose-Capillary: 245 mg/dL — ABNORMAL HIGH (ref 65–99)
Glucose-Capillary: 246 mg/dL — ABNORMAL HIGH (ref 65–99)

## 2015-03-27 LAB — MAGNESIUM: MAGNESIUM: 2 mg/dL (ref 1.7–2.4)

## 2015-03-27 LAB — AMMONIA: Ammonia: 21 umol/L (ref 9–35)

## 2015-03-27 MED ORDER — SPIRONOLACTONE 25 MG PO TABS: 12.5000 mg | ORAL_TABLET | Freq: Every day | ORAL | Status: AC

## 2015-03-27 MED ORDER — RIFAXIMIN 550 MG PO TABS: 550.0000 mg | ORAL_TABLET | Freq: Three times a day (TID) | ORAL | Status: AC

## 2015-03-27 MED ORDER — AMOXICILLIN-POT CLAVULANATE 875-125 MG PO TABS: 1.0000 | ORAL_TABLET | Freq: Two times a day (BID) | ORAL | Status: AC

## 2015-03-27 MED ORDER — AMOXICILLIN-POT CLAVULANATE 875-125 MG PO TABS
1.0000 | ORAL_TABLET | Freq: Two times a day (BID) | ORAL | Status: DC
  Administered 2015-03-27 – 2015-03-28 (×3): 1 via ORAL
  Filled 2015-03-27 (×3): qty 1

## 2015-03-27 MED ORDER — METOPROLOL TARTRATE 25 MG PO TABS: 25.0000 mg | ORAL_TABLET | Freq: Two times a day (BID) | ORAL | Status: AC

## 2015-03-27 MED ORDER — OXYCODONE HCL 10 MG PO TABS: 10.0000 mg | ORAL_TABLET | ORAL | Status: DC | PRN

## 2015-03-27 MED ORDER — SORAFENIB TOSYLATE 200 MG PO TABS
200.0000 mg | ORAL_TABLET | Freq: Every day | ORAL | Status: DC
  Filled 2015-03-27: qty 1

## 2015-03-27 MED ORDER — DICYCLOMINE HCL 20 MG PO TABS: 20.0000 mg | ORAL_TABLET | Freq: Four times a day (QID) | ORAL | Status: AC | PRN

## 2015-03-27 MED ORDER — OXYCODONE HCL 5 MG PO TABS
10.0000 mg | ORAL_TABLET | Freq: Four times a day (QID) | ORAL | Status: DC | PRN
  Administered 2015-03-27 – 2015-03-28 (×4): 10 mg via ORAL
  Filled 2015-03-27 (×4): qty 2

## 2015-03-27 MED ORDER — INSULIN GLARGINE 100 UNIT/ML ~~LOC~~ SOLN: 40.0000 [IU] | Freq: Every day | SUBCUTANEOUS | Status: AC

## 2015-03-27 MED ORDER — OXYCODONE HCL 5 MG PO TABS
10.0000 mg | ORAL_TABLET | ORAL | Status: DC | PRN
  Administered 2015-03-27: 10 mg via ORAL
  Filled 2015-03-27: qty 2

## 2015-03-27 MED ORDER — SORAFENIB TOSYLATE 200 MG PO TABS
400.0000 mg | ORAL_TABLET | Freq: Every day | ORAL | Status: DC
  Filled 2015-03-27 (×2): qty 2

## 2015-03-27 MED ORDER — LACTULOSE 10 GM/15ML PO SOLN: 20.0000 g | Freq: Two times a day (BID) | ORAL | Status: AC

## 2015-03-27 MED ORDER — POTASSIUM CHLORIDE CRYS ER 20 MEQ PO TBCR
40.0000 meq | EXTENDED_RELEASE_TABLET | Freq: Once | ORAL | Status: AC
Start: 2015-03-27 — End: 2015-03-27
  Administered 2015-03-27: 40 meq via ORAL
  Filled 2015-03-27: qty 2

## 2015-03-27 NOTE — Progress Notes (Signed)
PT Note addendum  After discussion with evaluating PT, she reports patient is appropriate for continued PT at [facility]patient [facility]hab facility.  Patient will benefit from PT to maximize potential/progression and recovery.   03/27/2015 [place], [place]

## 2015-03-27 NOTE — Progress Notes (Signed)
[facility]                                                                              Patient Demographics  Sean Beck, is a 65 y.o. male, DOB - 09/05/1950, [id number]  Admit date - 03/24/2015   Admitting Physician Reubin Milan, MD  Outpatient Primary MD for the patient is Pcp Not In System  LOS - 3   Chief Complaint  Patient presents with  . Weakness       Brief HPI   Patient is a 65 year old male with liver cirrhosis, hypertension, diabetes mellitus, presented with altered mental status, fevers and chills for 3 days. Patient's family reported that he was having weakness, fevers, decreased mentation. Patient was noted to have O2 sats of 85% by EMS. Chest x-ray showed new bibasilar infiltrates.   Assessment & Plan    Principal Problem: Acute encephalopathy/likely hepatic Encephalopathy with history of cirrhosis of liver: Mildly elevated ammonia, with positive asterixis worsening due to infectious etiology on admission. - Patient was placed on lactulose, rifaximin for 3-4 bowel movements a day. - Ammonia level 21, shouldn't is alert and oriented x3 - Obtain records from oncologist as he has a history of hepatocellular carcinoma for oral chemotherapies.  HCAP (healthcare-associated pneumonia): - Afebrile, no leukocytosis, change antibiotics to Augmentin, patient tolerating oral diet  Essential Hypertension: Blood pressure seems to be controlled, he is on no antihypertensive medications at home.  Lactic acidosis Unlikely sepsis, this is likely due to liver dysfunction.  Thrombocytopenia ([place]): - Likely due to cirrhosis. - Continue to monitor, platelets improving today, no bleeding  SVT on 3/8: - Resolved after adenosine 6 mg - Continue metoprolol 25 mg BID  - follow 2-d echo  Liver cirrhosis - Continue lactulose, rifaximin, spironolactone  Code Status: full code   Family Communication: Discussed in detail with the patient, all  imaging results, lab results explained to the patient and 2 sisters. Patient initially wanted to be transferred to [facility] yesterday however due to SVT the transfer was canceled. Today there are currently no beds are available at [facility] and does not have acute care needs. CM[facility].    Disposition Plan: CIR consult placed  Time Spent in minutes  25 minutes  Procedures  None   Consults   None   DVT Prophylaxis SCDs  Medications  Scheduled Meds: . amoxicillin-clavulanate  1 tablet Oral Q12H  . antiseptic oral rinse  7 mL Mouth Rinse BID  . insulin aspart  0-9 Units Subcutaneous TID WC  . insulin glargine  30 Units Subcutaneous QHS  . lactulose  20 g Oral BID  . metoprolol tartrate  25 mg Oral BID  . rifaximin  550 mg Oral TID  . spironolactone  12.5 mg Oral Daily   Continuous Infusions:  PRN Meds:.dicyclomine, diphenhydrAMINE, ondansetron **OR** ondansetron (ZOFRAN) IV, oxyCODONE, pantoprazole   Antibiotics   Anti-infectives    Start     Dose/Rate Route Frequency Ordered Stop   03/27/15 1000  amoxicillin-clavulanate (AUGMENTIN) 875-125 MG per tablet 1 tablet     1 tablet Oral Every 12  hours 03/27/15 0918     03/27/15 0000  amoxicillin-clavulanate (AUGMENTIN) 875-125 MG tablet     1 tablet Oral 2 times daily 03/27/15 0927     03/27/15 0000  rifaximin (XIFAXAN) 550 MG TABS tablet     550 mg Oral 3 times daily 03/27/15 0927     03/26/15 1000  rifaximin (XIFAXAN) tablet 550 mg     550 mg Oral 3 times daily 03/26/15 0853     03/26/15 0800  ampicillin-sulbactam (UNASYN) 1.5 g in sodium chloride 0.9 % 50 mL IVPB  Status:  Discontinued     1.5 g 100 mL/hr over 30 Minutes Intravenous Every 6 hours 03/26/15 0731 03/27/15 0917   03/25/15 1000  ceFEPIme (MAXIPIME) 1 g in dextrose 5 % 50 mL IVPB  Status:  Discontinued     1 g 100 mL/hr over 30 Minutes Intravenous Every 8 hours 03/25/15 0320 03/26/15 0731   03/25/15 0600   vancomycin (VANCOCIN) IVPB 750 mg/150 ml premix  Status:  Discontinued     750 mg 150 mL/hr over 60 Minutes Intravenous Every 12 hours 03/24/15 1932 03/26/15 0731   03/25/15 0200  piperacillin-tazobactam (ZOSYN) IVPB 3.375 g  Status:  Discontinued     3.375 g 12.5 mL/hr over 240 Minutes Intravenous Every 8 hours 03/24/15 1932 03/25/15 0320   03/24/15 1715  vancomycin (VANCOCIN) 2,000 mg in sodium chloride 0.9 % 500 mL IVPB     2,000 mg 250 mL/hr over 120 Minutes Intravenous  Once 03/24/15 1707 03/25/15 0025   03/24/15 1645  piperacillin-tazobactam (ZOSYN) IVPB 3.375 g     3.375 g 100 mL/hr over 30 Minutes Intravenous  Once 03/24/15 1637 03/24/15 1913   03/24/15 1645  vancomycin (VANCOCIN) IVPB 1000 mg/200 mL premix  Status:  Discontinued     1,000 mg 200 mL/hr over 60 Minutes Intravenous  Once 03/24/15 1637 03/24/15 1707        Subjective:   Clemente Handley was seen and examined today.  Patient upset that he was not transferred to [facility] yesterday. Then complaining about his narcotics and he doesnot like hydrocodone and states that he is not supposed to have hydrocodone. States he is on oxycodone.   Patient denies dizziness, chest pain, shortness of breath, N/V/D/C, new weakness, numbess, tingling. No acute events overnight.    Objective:   Filed Vitals:   03/26/15 2049 03/27/15 0502 03/27/15 0814 03/27/15 1033  BP: 167/81 144/59 154/72 167/75  Pulse: 102 84 83 96  Temp: 99 F (37.2 C) 98.6 F (37 C) 98.4 F (36.9 C)   TempSrc: Oral Oral Oral   Resp: 24 20 20    Height:      Weight: 85.6 kg (188 lb 11.4 oz)     SpO2: 96% 94% 96%     Intake/Output Summary (Last 24 hours) at 03/27/15 1332 Last data filed at 03/27/15 1331  Gross per 24 hour  Intake   1160 ml  Output    875 ml  Net    285 ml     Wt Readings from Last 3 Encounters:  03/26/15 85.6 kg (188 lb 11.4 oz)  02/14/15 76.204 kg (168 lb)  10/26/14 76.476 kg (168 lb 9.6 oz)     Exam  General: Alert and oriented  x 3, NAD  HEENT:  PERRLA, EOMI,  Neck: Supple  CVS: S1 S2 auscultated, no rubs, murmurs or gallops. Regular rate and rhythm.  Respiratory: Clear to auscultation bilaterally, no wheezing, rales or rhonchi  Abdomen: Soft, nontender,  distended, + bowel sounds  Ext: no cyanosis clubbing, trace edema  Neuro: alert and oriented 3, moving all 4 extremities  Skin: No rashes  Psych: somewhat irritated, alert and oriented x3    Data Review   Micro Results Recent Results (from the past 240 hour(s))  Blood Culture (routine x 2)     Status: None (Preliminary result)   Collection Time: 03/24/15  5:00 PM  Result Value Ref Range Status   Specimen Description BLOOD RIGHT ANTECUBITAL  Final   Special Requests BOTTLES DRAWN AEROBIC AND ANAEROBIC 5CC  Final   Culture NO GROWTH 3 DAYS  Final   Report Status PENDING  Incomplete  Urine culture     Status: None   Collection Time: 03/24/15  5:23 PM  Result Value Ref Range Status   Specimen Description URINE, CATHETERIZED  Final   Special Requests NONE  Final   Culture NO GROWTH 1 DAY  Final   Report Status 03/25/2015 FINAL  Final  Blood Culture (routine x 2)     Status: None (Preliminary result)   Collection Time: 03/24/15  6:10 PM  Result Value Ref Range Status   Specimen Description BLOOD RIGHT HAND  Final   Special Requests BOTTLES DRAWN AEROBIC AND ANAEROBIC 5CC  Final   Culture NO GROWTH 3 DAYS  Final   Report Status PENDING  Incomplete  MRSA PCR Screening     Status: None   Collection Time: 03/25/15  8:12 AM  Result Value Ref Range Status   MRSA by PCR NEGATIVE NEGATIVE Final    Comment:        The GeneXpert MRSA Assay (FDA approved for NASAL specimens only), is one component of a comprehensive MRSA colonization surveillance program. It is not intended to diagnose MRSA infection nor to guide or monitor treatment for MRSA infections.     Radiology Reports Dg Chest 2 View  03/24/2015  CLINICAL DATA:  Altered mental status  with fever. EXAM: CHEST  2 VIEW COMPARISON:  Radiographs 09/28/2011 and 04/26/2010. FINDINGS: There are lower lung volumes. This likely contributes to interval enlargement of the cardiac silhouette and unfolding of the aorta. There are new bilateral airspace opacities without significant pleural effusion. There is no pneumothorax. The bones appear unchanged. Postsurgical changes are present in the distal left clavicle. IMPRESSION: Lower lung volumes with new bilateral airspace opacities suspicious for pulmonary edema or atypical infection. No significant pleural effusion. Electronically Signed   By: Richardean Sale M.D.   On: 03/24/2015 17:43   Ct Head Wo Contrast  03/24/2015  CLINICAL DATA:  Confusion, altered mental status EXAM: CT HEAD WITHOUT CONTRAST TECHNIQUE: Contiguous axial images were obtained from the base of the skull through the vertex without intravenous contrast. COMPARISON:  07/19/2011, 04/22/2010 FINDINGS: Bilateral anterior inferior frontal lobe encephalomalacia stable. No abnormal attenuation to suggest mass or acute vascular territory infarct. No parenchymal hemorrhage. No extra-axial fluid. No hydrocephalus. Calvarium is intact. Minimal inflammation in the visualized paranasal sinuses. Moderate age-related cortical atrophy with mild low attenuation diffusely in the deep white matter. Evidence of prior left ocular surgery. IMPRESSION: Age-related involutional change with chronic frontal lobe encephalomalacia bilaterally. No acute findings. Electronically Signed   By: Skipper Cliche M.D.   On: 03/24/2015 20:05    CBC  Recent Labs Lab 03/24/15 1708 03/25/15 0543 03/26/15 0245 03/27/15 0619  WBC 12.1* 5.1 3.5* 4.0  HGB 10.7* 9.9* 10.0* 10.2*  HCT 33.6* 29.1* 31.4* 30.2*  PLT 35* PLATELET CLUMPS NOTED ON SMEAR, COUNT APPEARS  DECREASED 16* 18*  MCV 90.3 89.8 89.0 89.9  MCH 28.8 30.6 28.3 30.4  MCHC 31.8 34.0 31.8 33.8  RDW 17.4* 17.3* 17.2* 17.3*  LYMPHSABS 0.6* 0.5* 0.5* 0.4*    MONOABS 1.0 0.3 0.3 0.4  EOSABS 0.0 0.0 0.0 0.0  BASOSABS 0.0 0.0 0.0 0.0    Chemistries   Recent Labs Lab 03/24/15 1708 03/24/15 2334 03/25/15 0543 03/26/15 0245 03/27/15 0619  NA 138  --  136 139 141  K 4.8  --  4.0 3.4* 3.1*  CL 106  --  107 110 112*  CO2 20*  --  22 21* 22  GLUCOSE 121*  --  166* 212* 135*  BUN 35*  --  33* 20 14  CREATININE 1.67*  --  1.18 0.79 0.73  CALCIUM 8.1*  --  7.6* 7.7* 7.5*  MG  --  1.9  --   --  2.0  AST 169*  --  139* 87* 65*  ALT 55  --  53 46 46  ALKPHOS 57  --  51 53 49  BILITOT 1.2  --  0.9 0.9 1.5*   ------------------------------------------------------------------------------------------------------------------ estimated creatinine clearance is 107 mL/min (by C-G formula based on Cr of 0.73). ------------------------------------------------------------------------------------------------------------------ No results for input(s): HGBA1C in the last 72 hours. ------------------------------------------------------------------------------------------------------------------ No results for input(s): CHOL, HDL, LDLCALC, TRIG, CHOLHDL, LDLDIRECT in the last 72 hours. ------------------------------------------------------------------------------------------------------------------ No results for input(s): TSH, T4TOTAL, T3FREE, THYROIDAB in the last 72 hours.  Invalid input(s): FREET3 ------------------------------------------------------------------------------------------------------------------ No results for input(s): VITAMINB12, FOLATE, FERRITIN, TIBC, IRON, RETICCTPCT in the last 72 hours.  Coagulation profile  Recent Labs Lab 03/25/15 0543  INR 1.55*    No results for input(s): DDIMER in the last 72 hours.  Cardiac Enzymes No results for input(s): CKMB, TROPONINI, MYOGLOBIN in the last 168 hours.  Invalid input(s):  CK ------------------------------------------------------------------------------------------------------------------ Invalid input(s): [place]  03/25/15 2051 03/26/15 0957 03/26/15 1213 03/26/15 2047 03/27/15 0730 03/27/15 1146  GLUCAP 229* 159* 175* 159* 144* 245*     Inge Waldroup M.D. [facility] 03/27/2015, 1:32 PM  Pager: [phone] Between 7am to 7pm - call Pager - [phone]  After 7pm go to [email or web address] - password [email or web address]  Call night coverage person covering after 7pm

## 2015-03-27 NOTE — Evaluation (Signed)
Physical Therapy Evaluation Patient Details Name: Sean Beck. MRN: [id number] DOB: 1950-12-13 Today's Date: 03/27/2015   History of Present Illness  65 y.o. male admitted for altered mental status and PNA.  PMH consists of liver cancer and cirrhosis.  Clinical Impression  PT eval completed to assess family ability to transport pt via car to [facility] hospital in [place] for acute admission. No further PT services indicated. Recommend continued PT at [place].    Follow Up Recommendations Other (comment) (continue PT in acute care setting at [place])    Equipment Recommendations  Rolling walker with 5" wheels (Okay to [facility] to [facility], if needed at d/c, as long as pt to just perform stand-pivot transfers to/from w/c during transport.)    Recommendations for Other Services       Precautions / Restrictions Precautions Precautions: Fall      Mobility  Bed Mobility Overal bed mobility: Needs Assistance Bed Mobility: Supine to Sit;Sit to Supine     Supine to sit: Supervision;HOB elevated Sit to supine: Supervision;HOB elevated   General bed mobility comments: no physical assist, use of rails  Transfers Overall transfer level: Needs assistance Equipment used: Rolling walker (2 wheeled) Transfers: Sit to/from [facility] Sit to Stand: Min assist Stand pivot transfers: Min assist       General transfer comment: verbal cues for hand placement; assist to power up  Ambulation/Gait Ambulation/Gait assistance: Min guard Ambulation Distance (Feet): 125 Feet Assistive device: Rolling walker (2 wheeled) Gait Pattern/deviations: Step-through pattern;Decreased stride length   Gait velocity interpretation: Below normal speed for age/gender    Stairs            Wheelchair Mobility    Modified Rankin (Stroke Patients Only)       Balance                                             Pertinent Vitals/Pain Pain Assessment:  0-10 Pain Score: 4  Pain Location: abdomen Pain Descriptors / Indicators: Tightness;Sore Pain Intervention(s): Premedicated before session;Monitored during session    Home Living Family/patient expects to be discharged to:: Other (Comment)                 Additional Comments: transfer to [facility] hospital in [place]    Prior Function Level of Independence: Independent               Hand Dominance        Extremity/Trunk Assessment   Upper Extremity Assessment: Generalized weakness           Lower Extremity Assessment: Generalized weakness      Cervical / Trunk Assessment: Normal  Communication   Communication: No difficulties  Cognition Arousal/Alertness: Awake/alert Behavior During Therapy: WFL for tasks assessed/performed Overall Cognitive Status: Within Functional Limits for tasks assessed                      General Comments      Exercises        Assessment/Plan    PT Assessment Patient needs continued PT services (recommend PT at [facility])  PT Diagnosis Difficulty walking;Generalized weakness   PT Problem List Decreased strength;Decreased activity tolerance;Decreased balance;Decreased mobility;Pain;Decreased knowledge of use of DME  PT Treatment Interventions DME instruction;Gait training;Functional mobility training;Therapeutic activities;Patient/family education;Balance training   PT Goals (Current goals can be found  in the Care Plan section) Acute Rehab PT Goals Patient Stated Goal: independence PT Goal Formulation: All assessment and education complete, DC therapy    Frequency     Barriers to discharge        Co-evaluation               End of Session Equipment Utilized During Treatment: Gait belt Activity Tolerance: Patient tolerated treatment well Patient left: in bed;with call bell/phone within reach;with family/visitor present Nurse Communication: Mobility status         Time: [id number] PT Time Calculation (min)  (ACUTE ONLY): 23 min   Charges:   PT Evaluation $PT Eval Moderate Complexity: 1 Procedure PT Treatments $Gait Training: 8-22 mins   PT G Codes:        Lorriane Shire 03/27/2015, 11:59 AM

## 2015-03-27 NOTE — Progress Notes (Signed)
Dr. Letta Pate recommends SNF venue at this time at his current functional level. Not in need of inpatient hospital level rehab for his acute needs. I will alert RN CM and SW. [phone]

## 2015-03-27 NOTE — Progress Notes (Addendum)
03/27/15 1240  Patient has a bag of candy that family has brought in for him to eat. He has been eating [employer]izz[employer] alll morning. BS 242

## 2015-03-27 NOTE — Consult Note (Addendum)
Physical Medicine and Rehabilitation Consult  Reason for Consult: Hepatic encephalopathy.  Referring Physician: Dr. Tana Coast.    HPI: Sean Beck. is a 65 y.o. male with history of HTN, DM, hepatocellular CA, cirrhosis of the liver who was admitted on 03/24/15 with 2-3 day history of fever/chills, MS changes, poor po intake, falls and asterixis. He was found to have multiple healing wounds/blisters on BLE, febrile with T-102 and was hypoxic at admission. CXR with bilateral infiltrates and ammonia level elevated at 84. He was started on IV antibiotics for HCAP as well as IVF and lactulose for management of hepatic encephalopathy. He developed SVT past admission and converted to NSR with single dose metoprolol.  PT evaluation done today and CIR recommended for generalized weakness.    Patient perseverated on multiple concerns-- HA due to misplaced glasses as well as lack of his chemo and glaucoma meds-- asked his Nurse to contact attending for orders.   Review of Systems  Constitutional: Positive for malaise/fatigue.  HENT: Positive for hearing loss.   Eyes: Positive for blurred vision.  Respiratory: Negative for cough and shortness of breath.   Cardiovascular: Negative for chest pain and palpitations.  Gastrointestinal: Positive for abdominal pain and diarrhea. Negative for heartburn and nausea.  Genitourinary: Negative for dysuria and urgency.  Musculoskeletal: Positive for myalgias and back pain.  Neurological: Positive for dizziness, sensory change and headaches.  Psychiatric/Behavioral: The patient is nervous/anxious.       Past Medical History  Diagnosis Date  . Hypertension   . Diabetes mellitus   . Acute delirium   . Pulmonary edema   . Hypokalemia   . History of heroin use   . History of cirrhosis of liver     Past Surgical History  Procedure Laterality Date  . Cholecystectomy    . Shoulder surgery Left    History reviewed. No pertinent family history.     Social History:  Lives in a home--responsible for his housework and meals. Marland Kitchen He reports that he has been smoking Cigarettes about 4-5 daily.  He has a 7.5 pack-year smoking history. He does not have any smokeless tobacco history on file. He reports that he does not drink alcohol or use illicit drugs.    Allergies: No Known Allergies    Medications Prior to Admission  Medication Sig Dispense Refill  . clindamycin (CLEOCIN T) 1 % lotion Apply 1 application topically daily. Apply topically after showers    . diphenhydrAMINE (SOMINEX) 25 MG tablet Take 25 mg by mouth at bedtime as needed for itching or sleep.    Marland Kitchen gabapentin (NEURONTIN) 300 MG capsule Take 300 mg by mouth at bedtime.    Marland Kitchen glipiZIDE (GLUCOTROL) 10 MG tablet Take 10 mg by mouth 2 (two) times daily before a meal.    . hydrocortisone 2.5 % cream Apply 1 application topically daily.    Marland Kitchen ibuprofen (ADVIL,MOTRIN) 200 MG tablet Take 600 mg by mouth every 6 (six) hours as needed for mild pain. For pain.    Marland Kitchen insulin glargine (LANTUS) 100 UNIT/ML injection Inject 10 Units into the skin at bedtime.    . mirtazapine (REMERON) 15 MG tablet Take 15 mg by mouth at bedtime.    Marland Kitchen oxyCODONE (OXY IR/ROXICODONE) 5 MG immediate release tablet Take 5-10 mg by mouth every 6 (six) hours as needed for severe pain.    . potassium chloride SA (K-DUR,KLOR-CON) 20 MEQ tablet Take 20 mEq by mouth daily.    Marland Kitchen  senna (SENOKOT) 8.6 MG TABS tablet Take 1 tablet by mouth 2 (two) times daily.    . sodium chloride (OCEAN) 0.65 % SOLN nasal spray Place 1 spray into both nostrils as needed for congestion.    Marland Kitchen SORAfenib (NEXAVAR) 200 MG tablet Take 200-400 mg by mouth 2 (two) times daily. Give on an empty stomach 1 hour before or 2 hours after meals. 200mg  in the morning, 400mg  in the evening    . spironolactone (ALDACTONE) 25 MG tablet Take 50 mg by mouth daily.    . [DISCONTINUED] dicyclomine (BENTYL) 20 MG tablet Take 1 tablet (20 mg total) by mouth every 6  (six) hours as needed (abdominal cramping). 10 tablet 0  . [DISCONTINUED] insulin glargine (LANTUS) 100 UNIT/ML injection Inject 80 Units into the skin at bedtime.       Home: Home Living Family/patient expects to be discharged to:: Other (Comment) Additional Comments: transfer to [facility] hospital in [facility]  Functional History: Prior Function Level of Independence: Independent Functional Status:  Mobility: Bed Mobility Overal bed mobility: Needs Assistance Bed Mobility: Supine to Sit, Sit to Supine Supine to sit: Supervision, HOB elevated Sit to supine: Supervision, HOB elevated General bed mobility comments: no physical assist, use of rails Transfers Overall transfer level: Needs assistance Equipment used: Rolling walker (2 wheeled) Transfers: Sit to/from Stand, [employer] Transfers Sit to Stand: Min assist Stand pivot transfers: Min assist General transfer comment: verbal cues for hand placement; assist to power up Ambulation/Gait Ambulation/Gait assistance: Min guard Ambulation Distance (Feet): 125 Feet Assistive device: Rolling walker (2 wheeled) Gait Pattern/deviations: Step-through pattern, Decreased stride length Gait velocity interpretation: Below normal speed for age/gender    ADL:    Cognition: Cognition Overall Cognitive Status: Within Functional Limits for tasks assessed Cognition Arousal/Alertness: Awake/alert Behavior During Therapy: WFL for tasks assessed/performed Overall Cognitive Status: Within Functional Limits for tasks assessed   Blood pressure 167/75, pulse 96, temperature 98.4 F (36.9 C), temperature source Oral, resp. rate 20, height 6\' 2"  (1.88 m), weight 85.6 kg (188 lb 11.4 oz), SpO2 96 %. Physical Exam  Nursing note and vitals reviewed. Constitutional: He is oriented to person, place, and time. He appears well-developed and well-nourished.  HENT:  Head: Normocephalic and atraumatic.  Eyes: Conjunctivae are normal. Pupils are equal, round,  and reactive to light. Right eye exhibits no discharge. Left eye exhibits no discharge.  Neck: Neck supple.  Cardiovascular: Regular rhythm.  Tachycardia present.   Murmur heard. Respiratory: Effort normal and breath sounds normal. No respiratory distress. He has no wheezes.  GI: Bowel sounds are normal. Distention: due to ascites. There is no tenderness. There is no rebound.  Tight and protuberant.   Musculoskeletal: He exhibits edema.  Abraded area on right knee. 1+edema BLE.   Neurological: He is alert and oriented to person, place, and time.  Speech clear. Distracted but able to follows basic commands without difficulty.  Skin: Skin is warm and dry.  Psychiatric: His speech is normal. His mood appears anxious. He is slowed. Cognition and memory are normal.  Motor strength is 5/5 bilateral deltoid, biceps, triceps, grip, hip flexor, knee extensor, ankle dorsal flexor plantar flexor Bed mobility is modified independent Sit to stand is supervision Alert and oriented 3, able to follow simple and Two-step commands Results for orders placed or performed during the hospital encounter of 03/24/15 (from the past 24 hour(s))  Glucose, capillary     Status: Abnormal   Collection Time: 03/26/15  8:47 PM  Result Value  Ref Range   Glucose-Capillary 159 (H) 65 - 99 mg/dL  Comprehensive metabolic panel     Status: Abnormal   Collection Time: 03/27/15  6:19 AM  Result Value Ref Range   Sodium 141 135 - 145 mmol/L   Potassium 3.1 (L) 3.5 - 5.1 mmol/L   Chloride 112 (H) 101 - 111 mmol/L   CO2 22 22 - 32 mmol/L   Glucose, Bld 135 (H) 65 - 99 mg/dL   BUN 14 6 - 20 mg/dL   Creatinine, Ser 0.73 0.61 - 1.24 mg/dL   Calcium 7.5 (L) 8.9 - 10.3 mg/dL   Total Protein 5.2 (L) 6.5 - 8.1 g/dL   Albumin 1.8 (L) 3.5 - 5.0 g/dL   AST 65 (H) 15 - 41 U/L   ALT 46 17 - 63 U/L   Alkaline Phosphatase 49 38 - 126 U/L   Total Bilirubin 1.5 (H) 0.3 - 1.2 mg/dL   GFR calc non Af Amer >60 >60 mL/min   GFR calc Af  Amer >60 >60 mL/min   Anion gap 7 5 - 15  CBC WITH DIFFERENTIAL     Status: Abnormal   Collection Time: 03/27/15  6:19 AM  Result Value Ref Range   WBC 4.0 4.0 - 10.5 K/uL   RBC 3.36 (L) 4.22 - 5.81 MIL/uL   Hemoglobin 10.2 (L) 13.0 - 17.0 g/dL   HCT 30.2 (L) 39.0 - 52.0 %   MCV 89.9 78.0 - 100.0 fL   MCH 30.4 26.0 - 34.0 pg   MCHC 33.8 30.0 - 36.0 g/dL   RDW 17.3 (H) 11.5 - 15.5 %   Platelets 18 (LL) 150 - 400 K/uL   Neutrophils Relative % 78 %   Neutro Abs 3.1 1.7 - 7.7 K/uL   Lymphocytes Relative 11 %   Lymphs Abs 0.4 (L) 0.7 - 4.0 K/uL   Monocytes Relative 10 %   Monocytes Absolute 0.4 0.1 - 1.0 K/uL   Eosinophils Relative 1 %   Eosinophils Absolute 0.0 0.0 - 0.7 K/uL   Basophils Relative 1 %   Basophils Absolute 0.0 0.0 - 0.1 K/uL  Magnesium     Status: None   Collection Time: 03/27/15  6:19 AM  Result Value Ref Range   Magnesium 2.0 1.7 - 2.4 mg/dL  Glucose, capillary     Status: Abnormal   Collection Time: 03/27/15  7:30 AM  Result Value Ref Range   Glucose-Capillary 144 (H) 65 - 99 mg/dL   Comment 1 Notify RN    Comment 2 Document in Chart   Ammonia     Status: None   Collection Time: 03/27/15 10:42 AM  Result Value Ref Range   Ammonia 21 9 - 35 umol/L  Glucose, capillary     Status: Abnormal   Collection Time: 03/27/15 11:46 AM  Result Value Ref Range   Glucose-Capillary 245 (H) 65 - 99 mg/dL   Comment 1 Notify RN    Comment 2 Document in Chart    No results found.  Assessment/Plan: Diagnosis: Deconditioning after pneumonia in a patient with hepatocellular carcinoma and cirrhosis. His hepatic encephalopathy has improved 1. Does the need for close, 24 hr/day medical supervision in concert with the patient's rehab needs make it unreasonable for this patient to be served in a less intensive setting? No 2. Co-Morbidities requiring supervision/potential complications: Hypertension, diabetes 3. Due to bowel management, skin/wound care and disease management, does  the patient require 24 hr/day rehab nursing? No 4. Does the patient require  coordinated care of a physician, rehab nurse, PT, OT to address physical and functional deficits in the context of the above medical diagnosis(es)? No Addressing deficits in the following areas: balance, endurance, locomotion, strength, transferring, bathing, dressing and toileting 5. Can the patient actively participate in an intensive therapy program of at least 3 hrs of therapy per day at least 5 days per week? Potentially 6. The potential for patient to make measurable gains while on inpatient rehab is not applicable 7. Anticipated functional outcomes upon discharge from inpatient rehab are n/a  with PT, n/a with OT, n/a with SLP. 8. Estimated rehab length of stay to reach the above functional goals is: Not applicable 9. Does the patient have adequate social supports and living environment to accommodate these discharge functional goals? N/A 10. Anticipated D/C setting: Home 11. Anticipated post D/C treatments: [place] therapy 12. Overall Rehab/Functional Prognosis: excellent  RECOMMENDATIONS: This patient's condition is appropriate for continued rehabilitative care in the following setting: SNF Patient has agreed to participate in recommended program. Yes Note that insurance prior authorization may be required for reimbursement for recommended care.  Comment: Recommend SNF level rehabilitation, short stay, Discussed my recommendations with patient, his sister as well as case management    03/27/2015

## 2015-03-27 NOTE — Care Management Important Message (Signed)
Important Message  Patient Details  Name: Sean Beck. MRN: [id number] Date of Birth: 09-08-1950   Medicare Important Message Given:  Yes    Braelin Costlow Abena 03/27/2015, 11:42 AM

## 2015-03-28 ENCOUNTER — Inpatient Hospital Stay (HOSPITAL_COMMUNITY): Payer: Medicare Other

## 2015-03-28 DIAGNOSIS — R011 Cardiac murmur, unspecified: Secondary | ICD-10-CM

## 2015-03-28 DIAGNOSIS — D696 Thrombocytopenia, unspecified: Secondary | ICD-10-CM

## 2015-03-28 DIAGNOSIS — E7220 Disorder of urea cycle metabolism, unspecified: Secondary | ICD-10-CM

## 2015-03-28 LAB — COMPREHENSIVE METABOLIC PANEL
ALBUMIN: 1.9 g/dL — AB (ref 3.5–5.0)
ALK PHOS: 55 U/L (ref 38–126)
ALT: 44 U/L (ref 17–63)
AST: 60 U/L — AB (ref 15–41)
Anion gap: 8 (ref 5–15)
BILIRUBIN TOTAL: 1.4 mg/dL — AB (ref 0.3–1.2)
BUN: 13 mg/dL (ref 6–20)
CALCIUM: 7.5 mg/dL — AB (ref 8.9–10.3)
CO2: 22 mmol/L (ref 22–32)
CREATININE: 0.9 mg/dL (ref 0.61–1.24)
Chloride: 108 mmol/L (ref 101–111)
GFR calc Af Amer: >60 mL/min (ref >60)
GLUCOSE: 122 mg/dL — AB (ref 65–99)
Potassium: 3.5 mmol/L (ref 3.5–5.1)
Sodium: 138 mmol/L (ref 135–145)
TOTAL PROTEIN: 5.6 g/dL — AB (ref 6.5–8.1)

## 2015-03-28 LAB — CBC
HCT: 32.2 % — ABNORMAL LOW (ref 39.0–52.0)
Hemoglobin: 10.2 g/dL — ABNORMAL LOW (ref 13.0–17.0)
MCH: 28.7 pg (ref 26.0–34.0)
MCHC: 31.7 g/dL (ref 30.0–36.0)
MCV: 90.7 fL (ref 78.0–100.0)
PLATELETS: 24 10*3/uL — AB (ref 150–400)
RBC: 3.55 MIL/uL — ABNORMAL LOW (ref 4.22–5.81)
RDW: 17.6 % — AB (ref 11.5–15.5)
WBC: 6.4 10*3/uL (ref 4.0–10.5)

## 2015-03-28 LAB — ECHOCARDIOGRAM COMPLETE
AORTIC ROOT 2D: 33 mm
CHL CUP LA SIZE INDEX: 1.99 mm/m2
CHL CUP LA VOL 2D: 48.9 mL
CHL CUP MV DEC (S): 169 ms
E decel time: 169 msec
E/e' ratio: 9.27
FS: 21 % — AB (ref 28–44)
HEIGHTINCHES: 74 in
IV/PV OW: 0.73
LA ID, A-P, ES: 38 mm
LA VOL 2D INDEX: 25.6 mL/m2
LA vol index: 34.2 mL/m2
LA vol: 65.3 mL
LEFT ATRIUM END SYS DIAM: 38 mm
LV PW d: 14.1 mm — AB (ref 0.6–1.1)
LV TDI E'MEDIAL: 10.1 cm/s
LVIDD: 36.8 mm — AB (ref 3.5–6.0)
LVIDS: 29 mm — AB (ref 2.1–4.0)
LVOT area: 3.14 cm2
LVOT diameter: 20 mm
MVPG: 4 mmHg
MVPKAVEL: 121 cm/s
MVPKEVEL: 93.6 cm/s
PWSYS: 14.1 mm
TDI e' lateral: 10.1 cm/s
WEIGHTICAEL: 2352 [oz_av]

## 2015-03-28 LAB — GLUCOSE, CAPILLARY
GLUCOSE-CAPILLARY: 93 mg/dL (ref 65–99)
Glucose-Capillary: 170 mg/dL — ABNORMAL HIGH (ref 65–99)
Glucose-Capillary: 183 mg/dL — ABNORMAL HIGH (ref 65–99)

## 2015-03-28 MED ORDER — INSULIN ASPART 100 UNIT/ML ~~LOC~~ SOLN: 0.0000 [IU] | Freq: Three times a day (TID) | SUBCUTANEOUS | Status: AC

## 2015-03-28 MED ORDER — OXYCODONE HCL 10 MG PO TABS: 10.0000 mg | ORAL_TABLET | ORAL | Status: AC | PRN

## 2015-03-28 NOTE — Clinical Social Work Note (Signed)
Clinical Social Work Assessment  Patient Details  Name: Sean Beck. MRN: [id number] Date of Birth: 1950/05/22  Date of referral:  03/27/15               Reason for consult:  Facility Placement                Permission sought to share information with:  Other Permission granted to share information::  Yes, Verbal Permission Granted  Name::     [facility]  Agency::     Relationship::  Sister  Contact Information:  [phone]  Housing/Transportation Living arrangements for the past 2 months:  Group Home (Home is sponsored by [employer]. Address for home is [place]) Source of Information:  Patient, Other (Comment Required) (Sister Mignon Pine) Patient Interpreter Needed:  None Criminal Activity/Legal Involvement Pertinent to Current Situation/Hospitalization:  No - Comment as needed Significant Relationships:  Siblings Lives with:  Self, Other (Comment) (Others person also live in the adult care home run by [facility]) Do you feel safe going back to the place where you live?  Yes (Patient and sister understand that ST rehab needed before patient returns to [facility]) Need for family participation in patient care:  Yes (Comment)  Care giving concerns:  Patient and sister understand that patient not ready physically to return to [facility] and that short-term rehab needed.   Social Worker assessment / plan:  CSW talked with patient and his sister Hilda Blades at the bedside regarding his discharge plan. Patient is alert and lying in bed and allowed sister to converse with CSW regarding discharge planning. Mr. Calkin informed CSW when asked that he lives at [facility]. in [place] and reported that he pays to stay at this house. Both are in agreement with ST rehab and sister was provided with SNF list and a list of facilities that accepted patient. Ms. Davis Gourd indicated that she plans to visit facilities and will  get back with CSW.  Employment status:  Retired Forensic scientist:  Medicare PT Recommendations:  [place] / Referral to community resources:  [place] (Sister provided with list of facilities in [place])  Patient/Family's Response to care:  No concerns reported by patient or sister regarding his care during hospitalization.  Patient/Family's Understanding of and Emotional Response to Diagnosis, Current Treatment, and Prognosis:  Not discussed.  Emotional Assessment Appearance:  Appears stated age Attitude/Demeanor/Rapport:  Other (Appropriate) Affect (typically observed):  Appropriate, Pleasant, Calm Orientation:  Oriented to Self, Oriented to Place, Oriented to  Time, Oriented to Situation Alcohol / Substance use:  Tobacco Use, Alcohol Use (Patient reports that he smokes cigarettes and does not drink or use illicit drugs.) Psych involvement (Current and /or in the community):  No (Comment)  Discharge Needs  Concerns to be addressed:  Discharge Planning Concerns Readmission within the last 30 days:  No Current discharge risk:  None Barriers to Discharge:  No Barriers Identified   Sable Feil, LCSW 03/28/2015, 4:47 PM

## 2015-03-28 NOTE — Progress Notes (Signed)
  Echocardiogram 2D Echocardiogram has been performed.  Diamond Nickel 03/28/2015, 8:56 AM

## 2015-03-28 NOTE — Clinical Social Work Placement (Addendum)
   CLINICAL SOCIAL WORK PLACEMENT  NOTE 03/28/15 - DISCHARGED TO [place]  Date:  03/28/2015  Patient Details  Name: Sean Beck. MRN: [id number] Date of Birth: [birth year]  Clinical Social Work is seeking post-discharge placement for this patient at the [place] level of care (*CSW will initial, date and re-position this form in  chart as items are completed):  Yes   Patient/family provided with [place] Work Department's list of facilities offering this level of care within the geographic area requested by the patient (or if unable, by the patient's family).  Yes   Patient/family informed of their freedom to choose among providers that offer the needed level of care, that participate in Medicare, Medicaid or managed care program needed by the patient, have an available bed and are willing to accept the patient.  Yes   Patient/family informed of [facility]'s ownership interest in [facility] and [facility], as well as of the fact that they are under no obligation to receive care at these facilities.  PASRR submitted to EDS on 03/28/15     PASRR number received on 03/28/15     Existing PASRR number confirmed on       FL2 transmitted to all facilities in geographic area requested by pt/family on 03/28/15     FL2 transmitted to all facilities within larger geographic area on       Patient informed that his/her managed care company has contracts with or will negotiate with certain facilities, including the following:         03/28/15 - Patient/family informed of bed offers received.  Patient/sister chooses bed at  [facility]     Physician recommends and patient chooses bed at      Patient to be transferred to  [facility] on  03/28/15.  Patient to be transferred to facility by  ambulance.    Patient/family notified on  03/28/15 of transfer.  Name of family member notified:   Marden Noble.     PHYSICIAN Please prepare priority discharge summary, including medications, Please sign FL2, Please prepare prescriptions     Additional Comment:    _______________________________________________ Sable Feil, LCSW 03/28/2015, 11:24 AM

## 2015-03-28 NOTE — Progress Notes (Signed)
Patient for discharge to [facility]. Report given to [facility] C.-RN using SBAR. All questions were answered.

## 2015-03-28 NOTE — Discharge Summary (Signed)
Physician Discharge Summary  Marlou Sa. UO:[id number] DOB: 03-15-1950 DOA: 03/24/2015  PCP: Pcp Not In System  Admit date: 03/24/2015 Discharge date: 03/28/2015  Time spent: 35 minutes  Recommendations for Outpatient Follow-up:  1. Monitor blood sugars 2. Augmentin until 3/14 3. Bowel regimen for 3-4 BMs/day   Discharge Diagnoses:  Principal Problem:   Encephalopathy, hepatic (HCC) Active Problems:   Hypertension   History of cirrhosis of liver   Hyperammonemia (HCC)   Lactic acidosis   Thrombocytopenia (HCC)   HCAP (healthcare-associated pneumonia)   Discharge Condition: improved  Diet recommendation: low salt/carb mod  Filed Weights   03/25/15 0334 03/26/15 2049 03/27/15 2051  Weight: 85.231 kg (187 lb 14.4 oz) 85.6 kg (188 lb 11.4 oz) 66.679 kg (147 lb)    History of present illness:  Sean Beck. is a 65 y.o. male with below past medical history who is brought to the emergency department due to progressively worse mental status, chills and fever for 3 days.  [place] caregiver and family members, patient has been having weakness, low-grade temperatures and decreased mentation for the past few days. When EMS arrived his initial room air oxygen was 85% and increased to 97% with 4 L of supplemental oxygen via nasal cannula.   When seen, the patient was in no acute distress, but still having some confusion. Workup in the emergency department is significant for new bibasilar infiltrates on chest radiograph and leukocytosis.   Hospital Course:  Acute encephalopathy/likely hepatic Encephalopathy with history of cirrhosis of liver: Mildly elevated ammonia, with positive asterixis worsening due to infectious etiology on admission. - Patient was placed on lactulose, rifaximin for 3-4 bowel movements a day. - Ammonia level 21- patient alert and oriented - resume oral chemotherapies-- will need to follow at [place] (healthcare-associated pneumonia): - no  leukocytosis, change antibiotics to Augmentin- course until 3/14  Essential Hypertension: Blood pressure seems to be controlled, he is on no antihypertensive medications at home.  Lactic acidosis Unlikely sepsis, this is likely due to liver dysfunction.  Thrombocytopenia ([place]): - Likely due to cirrhosis. - Continue to monitor, platelets improving today, no bleeding  SVT on 3/8: - Resolved after adenosine 6 mg - Continue metoprolol 25 mg BID  - follow 2-d echo  Liver cirrhosis - Continue lactulose, rifaximin, spironolactone -not sure why patient not on lasix as well-- defer to follow up at [facility]   Procedures:    Consultations:    Discharge Exam: Filed Vitals:   03/28/15 0448 03/28/15 0902  BP: 129/61 137/81  Pulse: 89 89  Temp: 99 F (37.2 C) 98.4 F (36.9 C)  Resp: 22 22    General: awake, NAD Cardiovascular: rrr Respiratory: clear  Discharge Instructions   Discharge Instructions    Diet - low sodium heart healthy    Complete by:  As directed      Diet Carb Modified    Complete by:  As directed      Discharge instructions    Complete by:  As directed   Cbc, bmp 1 week Incentive spirometry     Increase activity slowly    Complete by:  As directed           Current Discharge Medication List    START taking these medications   Details  amoxicillin-clavulanate (AUGMENTIN) 875-125 MG tablet Take 1 tablet by mouth 2 (two) times daily. Qty: 20 tablet, Refills: 0    insulin aspart (NOVOLOG) 100 UNIT/ML injection Inject 0-9 Units  into the skin 3 (three) times daily with meals. Qty: 10 mL, Refills: 11    lactulose (CHRONULAC) 10 GM/15ML solution Take 30 mLs (20 g total) by mouth 2 (two) times daily. Titrate for 2-3 BM's in a day Qty: 240 mL, Refills: 4    metoprolol tartrate (LOPRESSOR) 25 MG tablet Take 1 tablet (25 mg total) by mouth 2 (two) times daily. Qty: 60 tablet, Refills: 3    rifaximin (XIFAXAN) 550 MG TABS tablet Take 1 tablet (550 mg  total) by mouth 3 (three) times daily. Qty: 90 tablet, Refills: 0      CONTINUE these medications which have CHANGED   Details  dicyclomine (BENTYL) 20 MG tablet Take 1 tablet (20 mg total) by mouth every 6 (six) hours as needed (abdominal cramping). Qty: 30 tablet, Refills: 0    insulin glargine (LANTUS) 100 UNIT/ML injection Inject 0.4 mLs (40 Units total) into the skin at bedtime. Qty: 10 mL, Refills: 11    Oxycodone HCl 10 MG TABS Take 1 tablet (10 mg total) by mouth every 4 (four) hours as needed. Qty: 15 tablet, Refills: 0    spironolactone (ALDACTONE) 25 MG tablet Take 0.5 tablets (12.5 mg total) by mouth daily. Qty: 30 tablet, Refills: 1      CONTINUE these medications which have NOT CHANGED   Details  diphenhydrAMINE (SOMINEX) 25 MG tablet Take 25 mg by mouth at bedtime as needed for itching or sleep.    gabapentin (NEURONTIN) 300 MG capsule Take 300 mg by mouth at bedtime.    glipiZIDE (GLUCOTROL) 10 MG tablet Take 10 mg by mouth 2 (two) times daily before a meal.    mirtazapine (REMERON) 15 MG tablet Take 15 mg by mouth at bedtime.    potassium chloride SA (K-DUR,KLOR-CON) 20 MEQ tablet Take 20 mEq by mouth daily.    sodium chloride (OCEAN) 0.65 % SOLN nasal spray Place 1 spray into both nostrils as needed for congestion.    SORAfenib (NEXAVAR) 200 MG tablet Take 200-400 mg by mouth 2 (two) times daily. Give on an empty stomach 1 hour before or 2 hours after meals. 200mg  in the morning, 400mg  in the evening      STOP taking these medications     clindamycin (CLEOCIN T) 1 % lotion      hydrocortisone 2.5 % cream      ibuprofen (ADVIL,MOTRIN) 200 MG tablet      senna (SENOKOT) 8.6 MG TABS tablet      HYDROcodone-acetaminophen (NORCO/VICODIN) 5-325 MG tablet      pantoprazole (PROTONIX) 20 MG tablet        No Known Allergies    The results of significant diagnostics from this hospitalization (including imaging, microbiology, ancillary and laboratory)  are listed below for reference.    Significant Diagnostic Studies: Dg Chest 2 View  03/24/2015  CLINICAL DATA:  Altered mental status with fever. EXAM: CHEST  2 VIEW COMPARISON:  Radiographs 09/28/2011 and 04/26/2010. FINDINGS: There are lower lung volumes. This likely contributes to interval enlargement of the cardiac silhouette and unfolding of the aorta. There are new bilateral airspace opacities without significant pleural effusion. There is no pneumothorax. The bones appear unchanged. Postsurgical changes are present in the distal left clavicle. IMPRESSION: Lower lung volumes with new bilateral airspace opacities suspicious for pulmonary edema or atypical infection. No significant pleural effusion. Electronically Signed   By: Richardean Sale M.D.   On: 03/24/2015 17:43   Ct Head Wo Contrast  03/24/2015  CLINICAL DATA:  Confusion, altered mental status EXAM: CT HEAD WITHOUT CONTRAST TECHNIQUE: Contiguous axial images were obtained from the base of the skull through the vertex without intravenous contrast. COMPARISON:  07/19/2011, 04/22/2010 FINDINGS: Bilateral anterior inferior frontal lobe encephalomalacia stable. No abnormal attenuation to suggest mass or acute vascular territory infarct. No parenchymal hemorrhage. No extra-axial fluid. No hydrocephalus. Calvarium is intact. Minimal inflammation in the visualized paranasal sinuses. Moderate age-related cortical atrophy with mild low attenuation diffusely in the deep white matter. Evidence of prior left ocular surgery. IMPRESSION: Age-related involutional change with chronic frontal lobe encephalomalacia bilaterally. No acute findings. Electronically Signed   By: Skipper Cliche M.D.   On: 03/24/2015 20:05    Microbiology: Recent Results (from the past 240 hour(s))  Blood Culture (routine x 2)     Status: None (Preliminary result)   Collection Time: 03/24/15  5:00 PM  Result Value Ref Range Status   Specimen Description BLOOD RIGHT ANTECUBITAL   Final   Special Requests BOTTLES DRAWN AEROBIC AND ANAEROBIC 5CC  Final   Culture NO GROWTH 4 DAYS  Final   Report Status PENDING  Incomplete  Urine culture     Status: None   Collection Time: 03/24/15  5:23 PM  Result Value Ref Range Status   Specimen Description URINE, CATHETERIZED  Final   Special Requests NONE  Final   Culture NO GROWTH 1 DAY  Final   Report Status 03/25/2015 FINAL  Final  Blood Culture (routine x 2)     Status: None (Preliminary result)   Collection Time: 03/24/15  6:10 PM  Result Value Ref Range Status   Specimen Description BLOOD RIGHT HAND  Final   Special Requests BOTTLES DRAWN AEROBIC AND ANAEROBIC 5CC  Final   Culture NO GROWTH 4 DAYS  Final   Report Status PENDING  Incomplete  MRSA PCR Screening     Status: None   Collection Time: 03/25/15  8:12 AM  Result Value Ref Range Status   MRSA by PCR NEGATIVE NEGATIVE Final    Comment:        The GeneXpert MRSA Assay (FDA approved for NASAL specimens only), is one component of a comprehensive MRSA colonization surveillance program. It is not intended to diagnose MRSA infection nor to guide or monitor treatment for MRSA infections.      Labs: Basic Metabolic Panel:  Recent Labs Lab 03/24/15 1708 03/24/15 2334 03/25/15 0543 03/26/15 0245 03/27/15 0619 03/28/15 0535  NA 138  --  136 139 141 138  K 4.8  --  4.0 3.4* 3.1* 3.5  CL 106  --  107 110 112* 108  CO2 20*  --  22 21* 22 22  GLUCOSE 121*  --  166* 212* 135* 122*  BUN 35*  --  33* 20 14 13   CREATININE 1.67*  --  1.18 0.79 0.73 0.90  CALCIUM 8.1*  --  7.6* 7.7* 7.5* 7.5*  MG  --  1.9  --   --  2.0  --   PHOS  --  6.0*  --   --   --   --    Liver Function Tests:  Recent Labs Lab 03/24/15 1708 03/25/15 0543 03/26/15 0245 03/27/15 0619 03/28/15 0535  AST 169* 139* 87* 65* 60*  ALT 55 53 46 46 44  ALKPHOS 57 51 53 49 55  BILITOT 1.2 0.9 0.9 1.5* 1.4*  PROT 5.7* 5.4* 5.2* 5.2* 5.6*  ALBUMIN 2.4* 2.1* 1.9* 1.8* 1.9*   No results  for input(s): LIPASE,  AMYLASE in the last 168 hours.  Recent Labs Lab 03/24/15 1900 03/26/15 0245 03/27/15 1042  AMMONIA 84* 80* 21   CBC:  Recent Labs Lab 03/24/15 1708 03/25/15 0543 03/26/15 0245 03/27/15 0619 03/28/15 0535  WBC 12.1* 5.1 3.5* 4.0 6.4  NEUTROABS 10.5* 4.2 2.7 3.1  --   HGB 10.7* 9.9* 10.0* 10.2* 10.2*  HCT 33.6* 29.1* 31.4* 30.2* 32.2*  MCV 90.3 89.8 89.0 89.9 90.7  PLT 35* PLATELET CLUMPS NOTED ON SMEAR, COUNT APPEARS DECREASED 16* 18* 24*   Cardiac Enzymes: No results for input(s): CKTOTAL, CKMB, CKMBINDEX, TROPONINI in the last 168 hours. BNP: BNP (last 3 results)  Recent Labs  03/24/15 2145 03/25/15 1411  BNP 81.1 93.1    ProBNP (last 3 results) No results for input(s): PROBNP in the last 8760 hours.  CBG:  Recent Labs Lab 03/27/15 0730 03/27/15 1146 03/27/15 1705 03/28/15 0741 03/28/15 1130  GLUCAP 144* 245* 246* 93 170*       Signed:  Karolyne Timmons U Kahlen Boyde DO.  [facility] 03/28/2015, 2:07 PM

## 2015-03-28 NOTE — Progress Notes (Signed)
Notified Sean Beck of T- 100.70F. She said, patient will be discharged with oral antibiotics and IS.

## 2015-03-28 NOTE — NC FL2 (Signed)
[place] LEVEL OF CARE SCREENING TOOL     IDENTIFICATION  Patient Name: Sean Beck. Birthdate: [birth year] Sex: male Admission Date (Current Location): 03/24/2015  [facility] and [place] Number:  Herbalist and Address:  [facility] [place] [place] [address] [place], [place] [address]      Provider Number: [id number]  Attending Physician Name and Address:  Geradine Girt, DO  Relative Name and Phone Number:  Mignon Pine - sister.  Phone [phone]    Current Level of Care: Hospital Recommended Level of Care: [place] Prior Approval Number:    Date Approved/Denied:   PASRR Number: [id number] (Eff. 03/28/15)  Discharge Plan: SNF    Current Diagnoses: Patient Active Problem List   Diagnosis Date Noted  . Encephalopathy, hepatic ([place]) 03/24/2015  . Hyperammonemia ([place]) 03/24/2015  . Lactic acidosis 03/24/2015  . Thrombocytopenia ([place]) 03/24/2015  . HCAP (healthcare-associated pneumonia) 03/24/2015  . Hypertension   . Acute delirium   . Pulmonary edema   . Hypokalemia   . History of heroin use   . History of cirrhosis of liver     Orientation RESPIRATION BLADDER Height & Weight     Self, Time, Situation, Place  Normal Continent Weight: 147 lb (66.679 kg) Height:  6\' 2"  (188 cm)  BEHAVIORAL SYMPTOMS/MOOD NEUROLOGICAL BOWEL NUTRITION STATUS      Continent Diet (Heart Healthy - Carb Modified)  AMBULATORY STATUS COMMUNICATION OF NEEDS Skin   Limited Assist Verbally Normal                       Personal Care Assistance Level of Assistance  Bathing, Feeding, Dressing Bathing Assistance: Limited assistance Feeding assistance: Independent Dressing Assistance: Limited assistance     Functional Limitations Info  Sight, Hearing, Speech Sight Info: Impaired Hearing Info: Adequate Speech Info: Adequate    SPECIAL CARE FACTORS FREQUENCY  PT (By licensed PT)     PT Frequency: Evaluation 3/9.               Contractures Contractures Info: Not present    Additional Factors Info  Code Status, Allergies, Insulin Sliding Scale Code Status Info: Full code Allergies Info: No known allergies   Insulin Sliding Scale Info: 0-9 units 3 times per day with meals.       Current Medications (03/28/2015):  This is the current hospital active medication list Current Facility-Administered Medications  Medication Dose Route Frequency Provider Last Rate Last Dose  . amoxicillin-clavulanate (AUGMENTIN) 875-125 MG per tablet 1 tablet  1 tablet Oral Q12H Ripudeep Krystal Eaton, MD   1 tablet at 03/28/15 1056  . antiseptic oral rinse (CPC / CETYLPYRIDINIUM CHLORIDE 0.05%) solution 7 mL  7 mL Mouth Rinse BID Belkys A Regalado, MD   7 mL at 03/27/15 2229  . dicyclomine (BENTYL) tablet 20 mg  20 mg Oral Q6H PRN Reubin Milan, MD   20 mg at 03/27/15 0432  . diphenhydrAMINE (BENADRYL) capsule 25 mg  25 mg Oral QHS PRN Reubin Milan, MD      . insulin aspart (novoLOG) injection 0-9 Units  0-9 Units Subcutaneous TID WC Reubin Milan, MD   3 Units at 03/27/15 1836  . insulin glargine (LANTUS) injection 30 Units  30 Units Subcutaneous QHS Belkys A Regalado, MD   30 Units at 03/27/15 2229  . lactulose (CHRONULAC) 10 GM/15ML solution 20 g  20 g Oral BID Reubin Milan, MD  20 g at 03/26/15 1000  . metoprolol tartrate (LOPRESSOR) tablet 25 mg  25 mg Oral BID Charlynne Cousins, MD   25 mg at 03/28/15 1056  . ondansetron (ZOFRAN) tablet 4 mg  4 mg Oral Q6H PRN Reubin Milan, MD       Or  . ondansetron [facility]) injection 4 mg  4 mg Intravenous Q6H PRN Reubin Milan, MD      . oxyCODONE (Oxy IR/ROXICODONE) immediate release tablet 10 mg  10 mg Oral Q6H PRN Ripudeep Krystal Eaton, MD   10 mg at 03/28/15 0820  . pantoprazole (PROTONIX) EC tablet 20 mg  20 mg Oral Daily PRN Reubin Milan, MD      . rifaximin Doreene Nest) tablet 550 mg  550 mg Oral TID Charlynne Cousins, MD   550 mg at 03/28/15  1056  . SORAfenib (NEXAVAR) tablet 200 mg  200 mg Oral QAC breakfast Ripudeep Krystal Eaton, MD   200 mg at 03/28/15 0939   And  . SORAfenib (NEXAVAR) tablet 400 mg  400 mg Oral QAC supper Ripudeep Krystal Eaton, MD   400 mg at 03/27/15 1645  . spironolactone (ALDACTONE) tablet 12.5 mg  12.5 mg Oral Daily Charlynne Cousins, MD   12.5 mg at 03/28/15 1056     Discharge Medications: Please see discharge summary for a list of discharge medications.  Relevant Imaging Results:  Relevant Lab Results:   Additional Information [id number]  Sable Feil, LCSW

## 2015-03-29 LAB — CULTURE, BLOOD (ROUTINE X 2)
CULTURE: NO GROWTH
Culture: NO GROWTH

## 2015-03-31 NOTE — Care Management (Signed)
Late Entry  03/31/2015 Received call from [place] re disposition of this pt. D/C summary faxed per her request to 1 [phone] with note re placement at [facility], for short term rehab.  Jasmine Pang RN MPH, case Freight forwarder. [phone]

## 2015-05-19 DEATH — deceased
